# Patient Record
Sex: Female | Born: 1995 | Race: White | Hispanic: No | Marital: Single | Smoking: Former smoker
Health system: Southern US, Community
[De-identification: ages and names within clinical notes are randomized; demographics above are authoritative.]

## PROBLEM LIST (undated history)

## (undated) DIAGNOSIS — O24419 Gestational diabetes mellitus in pregnancy, unspecified control: Secondary | ICD-10-CM

## (undated) DIAGNOSIS — E119 Type 2 diabetes mellitus without complications: Secondary | ICD-10-CM

## (undated) DIAGNOSIS — F32A Depression, unspecified: Secondary | ICD-10-CM

## (undated) DIAGNOSIS — F329 Major depressive disorder, single episode, unspecified: Secondary | ICD-10-CM

## (undated) HISTORY — PX: NO PAST SURGERIES: SHX2092

## (undated) HISTORY — DX: Major depressive disorder, single episode, unspecified: F32.9

## (undated) HISTORY — DX: Gestational diabetes mellitus in pregnancy, unspecified control: O24.419

## (undated) HISTORY — DX: Depression, unspecified: F32.A

---

## 2018-04-14 ENCOUNTER — Ambulatory Visit (INDEPENDENT_AMBULATORY_CARE_PROVIDER_SITE_OTHER): Payer: PRIVATE HEALTH INSURANCE | Admitting: Family Medicine

## 2018-04-14 ENCOUNTER — Encounter: Payer: Self-pay | Admitting: Family Medicine

## 2018-04-14 VITALS — BP 124/80 | HR 117 | Temp 98.2°F | Ht 63.0 in | Wt 197.5 lb

## 2018-04-14 DIAGNOSIS — J069 Acute upper respiratory infection, unspecified: Secondary | ICD-10-CM | POA: Diagnosis not present

## 2018-04-14 DIAGNOSIS — H6982 Other specified disorders of Eustachian tube, left ear: Secondary | ICD-10-CM | POA: Diagnosis not present

## 2018-04-14 DIAGNOSIS — H6502 Acute serous otitis media, left ear: Secondary | ICD-10-CM | POA: Insufficient documentation

## 2018-04-14 MED ORDER — PREDNISONE 20 MG PO TABS: 20.0000 mg | ORAL_TABLET | Freq: Two times a day (BID) | ORAL | 0 refills | Status: AC

## 2018-04-14 MED ORDER — SALINE SPRAY 0.65 % NA SOLN: 1.0000 | NASAL | 0 refills | Status: DC | PRN

## 2018-04-14 NOTE — Progress Notes (Signed)
Established Patient Office Visit  Subjective:  Patient ID: Tricia Solomon, female    DOB: Jul 25, 1995  Age: 23 y.o. MRN: [id number]  CC:  Chief Complaint  Patient presents with  . Establish Care    HPI Tricia Solomon presents for treatment and evaluation of a 3-day history of URI symptoms to include nasal congestion postnasal drip, left ear congestion with pain.  There is been no facial pressure teeth pain fever chills nausea or vomiting.  Patient denies headache.  Patient has no asthma history.  She does not smoke.  She did have a history of otitis as a child.  Patient is tried Sudafed without relief.  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  Her parents are in their early 10s and in relatively good health.  Mom recently had a hysterectomy.  Past Medical History:  Diagnosis Date  . Depression     History reviewed. No pertinent surgical history.  History reviewed. No pertinent family history.  Social History   Socioeconomic History  . Marital status: Single    Spouse name: Not on file  . Number of children: Not on file  . Years of education: Not on file  . Highest education level: Not on file  Occupational History  . Not on file  Social Needs  . Financial resource strain: Not on file  . Food insecurity:    Worry: Not on file    Inability: Not on file  . Transportation needs:    Medical: Not on file    Non-medical: Not on file  Tobacco Use  . Smoking status: Current Every Day Smoker  . Smokeless tobacco: Never Used  Substance and Sexual Activity  . Alcohol use: Yes  . Drug use: Yes  . Sexual activity: Yes  Lifestyle  . Physical activity:    Days per week: Not on file    Minutes per session: Not on file  . Stress: Not on file  Relationships  . Social connections:    Talks on phone: Not on file    Gets together: Not on file    Attends religious service: Not on file    Active member of club or organization:  Not on file    Attends meetings of clubs or organizations: Not on file    Relationship status: Not on file  . Intimate partner violence:    Fear of current or ex partner: Not on file    Emotionally abused: Not on file    Physically abused: Not on file    Forced sexual activity: Not on file  Other Topics Concern  . Not on file  Social History Narrative  . Not on file    No outpatient medications prior to visit.   No facility-administered medications prior to visit.     No Known Allergies  ROS Review of Systems  Constitutional: Negative for chills, diaphoresis, fatigue, fever and unexpected weight change.  HENT: Positive for congestion, ear pain, hearing loss, postnasal drip, rhinorrhea and sore throat. Negative for ear discharge, sinus pressure, sinus pain, sneezing and voice change.   Eyes: Negative for photophobia and visual disturbance.  Respiratory: Negative for cough, shortness of breath and wheezing.   Cardiovascular: Negative.   Gastrointestinal: Negative.  Negative for nausea and vomiting.  Musculoskeletal: Negative for arthralgias and myalgias.  Skin: Negative for pallor and rash.  Allergic/Immunologic: Negative for immunocompromised state.  Neurological: Negative for headaches.  Hematological: Does not  bruise/bleed easily.  Psychiatric/Behavioral: Negative.       Objective:    Physical Exam  Constitutional: She is oriented to person, place, and time. She appears well-developed and well-nourished. No distress.  HENT:  Head: Normocephalic and atraumatic.  Right Ear: External ear normal. Tympanic membrane is not injected, not scarred, not perforated, not erythematous, not retracted and not bulging.  Left Ear: External ear normal. Tympanic membrane is injected. Tympanic membrane is not scarred, not perforated, not retracted and not bulging. A middle ear effusion (CLEAR) is present.  Mouth/Throat: Oropharynx is clear and moist. No oropharyngeal exudate.  Eyes:  Pupils are equal, round, and reactive to light. Conjunctivae are normal. Right eye exhibits no discharge. Left eye exhibits no discharge. No scleral icterus.  Neck: Neck supple. No JVD present. No tracheal deviation present. No thyromegaly present.  Cardiovascular: Normal rate, regular rhythm and normal heart sounds.  Pulmonary/Chest: Effort normal and breath sounds normal. No stridor. No respiratory distress. She has no wheezes. She has no rales.  Abdominal: Bowel sounds are normal.  Lymphadenopathy:    She has no cervical adenopathy.  Neurological: She is alert and oriented to person, place, and time.  Skin: Skin is warm and dry. She is not diaphoretic.  Psychiatric: She has a normal mood and affect. Her behavior is normal.    BP 124/80   Pulse (!) 117   Temp 98.2 F (36.8 C) (Oral)   Ht 5' 3"  (1.6 m)   Wt 197 lb 8 oz (89.6 kg)   LMP 03/25/2018   SpO2 97%   BMI 34.99 kg/m  Wt Readings from Last 3 Encounters:  04/14/18 197 lb 8 oz (89.6 kg)   BP Readings from Last 3 Encounters:  04/14/18 124/80   Guideline developer:  UpToDate (see UpToDate for funding source) Date Released: June 2014  Health Maintenance Due  Topic Date Due  . HIV Screening  06/23/2012  . TETANUS/TDAP  06/23/2016  . INFLUENZA VACCINE  10/23/2017    There are no preventive care reminders to display for this patient.  No results found for: TSH No results found for: WBC, HGB, HCT, MCV, PLT No results found for: NA, K, CHLORIDE, CO2, GLUCOSE, BUN, CREATININE, BILITOT, ALKPHOS, AST, ALT, PROT, ALBUMIN, CALCIUM, ANIONGAP, EGFR, GFR No results found for: CHOL No results found for: HDL No results found for: LDLCALC No results found for: TRIG No results found for: CHOLHDL No results found for: HGBA1C    Assessment & Plan:   Problem List Items Addressed This Visit      Respiratory   Viral upper respiratory tract infection - Primary   Relevant Medications   sodium chloride (OCEAN) 0.65 % SOLN nasal  spray     Nervous and Auditory   Acute serous otitis media of left ear   Relevant Medications   predniSONE (DELTASONE) 20 MG tablet   Dysfunction of left eustachian tube   Relevant Medications   predniSONE (DELTASONE) 20 MG tablet      Meds ordered this encounter  Medications  . predniSONE (DELTASONE) 20 MG tablet    Sig: Take 1 tablet (20 mg total) by mouth 2 (two) times daily with a meal for 7 days.    Dispense:  14 tablet    Refill:  0  . sodium chloride (OCEAN) 0.65 % SOLN nasal spray    Sig: Place 1 spray into both nostrils as needed for congestion.    Dispense:  1 Bottle    Refill:  0  Follow-up: Return in about 6 days (around 04/20/2018), or if symptoms worsen or fail to improve.  Patient was given information on eustachian tube dysfunction.  We discussed some of the common side effects of prednisone therapy.  She will use saltwater nose spray liberally.  Follow-up early next week if not entirely improved.

## 2018-04-14 NOTE — Patient Instructions (Signed)

## 2018-05-08 ENCOUNTER — Encounter: Payer: Self-pay | Admitting: Family Medicine

## 2018-05-08 ENCOUNTER — Ambulatory Visit (INDEPENDENT_AMBULATORY_CARE_PROVIDER_SITE_OTHER): Payer: PRIVATE HEALTH INSURANCE | Admitting: Family Medicine

## 2018-05-08 VITALS — BP 128/80 | HR 80 | Temp 98.1°F | Ht 63.0 in | Wt 202.0 lb

## 2018-05-08 DIAGNOSIS — Z0000 Encounter for general adult medical examination without abnormal findings: Secondary | ICD-10-CM | POA: Diagnosis not present

## 2018-05-08 DIAGNOSIS — F418 Other specified anxiety disorders: Secondary | ICD-10-CM | POA: Diagnosis not present

## 2018-05-08 DIAGNOSIS — Z3009 Encounter for other general counseling and advice on contraception: Secondary | ICD-10-CM | POA: Insufficient documentation

## 2018-05-08 MED ORDER — PAROXETINE HCL 10 MG PO TABS: ORAL_TABLET | ORAL | 0 refills | Status: DC

## 2018-05-08 NOTE — Patient Instructions (Signed)
Living With Depression Everyone experiences occasional disappointment, sadness, and loss in their lives. When you are feeling down, blue, or sad for at least 2 weeks in a row, it may mean that you have depression. Depression can affect your thoughts and feelings, relationships, daily activities, and physical health. It is caused by changes in the way your brain functions. If you receive a diagnosis of depression, your health care provider will tell you which type of depression you have and what treatment options are available to you. If you are living with depression, there are ways to help you recover from it and also ways to prevent it from coming back. How to cope with lifestyle changes Coping with stress     Stress is your body's reaction to life changes and events, both good and bad. Stressful situations may include:  Getting married.  The death of a spouse.  Losing a job.  Retiring.  Having a baby. Stress can last just a few hours or it can be ongoing. Stress can play a major role in depression, so it is important to learn both how to cope with stress and how to think about it differently. Talk with your health care provider or a counselor if you would like to learn more about stress reduction. He or she may suggest some stress reduction techniques, such as:  Music therapy. This can include creating music or listening to music. Choose music that you enjoy and that inspires you.  Mindfulness-based meditation. This kind of meditation can be done while sitting or walking. It involves being aware of your normal breaths, rather than trying to control your breathing.  Centering prayer. This is a kind of meditation that involves focusing on a spiritual word or phrase. Choose a word, phrase, or sacred image that is meaningful to you and that brings you peace.  Deep breathing. To do this, expand your stomach and inhale slowly through your nose. Hold your breath for 3-5 seconds, then exhale  slowly, allowing your stomach muscles to relax.  Muscle relaxation. This involves intentionally tensing muscles then relaxing them. Choose a stress reduction technique that fits your lifestyle and personality. Stress reduction techniques take time and practice to develop. Set aside 5-15 minutes a day to do them. Therapists can offer training in these techniques. The training may be covered by some insurance plans. Other things you can do to manage stress include:  Keeping a stress diary. This can help you learn what triggers your stress and ways to control your response.  Understanding what your limits are and saying no to requests or events that lead to a schedule that is too full.  Thinking about how you respond to certain situations. You may not be able to control everything, but you can control how you react.  Adding humor to your life by watching funny films or TV shows.  Making time for activities that help you relax and not feeling guilty about spending your time this way.  Medicines Your health care provider may suggest certain medicines if he or she feels that they will help improve your condition. Avoid using alcohol and other substances that may prevent your medicines from working properly (may interact). It is also important to:  Talk with your pharmacist or health care provider about all the medicines that you take, their possible side effects, and what medicines are safe to take together.  Make it your goal to take part in all treatment decisions (shared decision-making). This includes giving input on   the side effects of medicines. It is best if shared decision-making with your health care provider is part of your total treatment plan. If your health care provider prescribes a medicine, you may not notice the full benefits of it for 4-8 weeks. Most people who are treated for depression need to be on medicine for at least 6-12 months after they feel better. If you are taking  medicines as part of your treatment, do not stop taking medicines without first talking to your health care provider. You may need to have the medicine slowly decreased (tapered) over time to decrease the risk of harmful side effects. Relationships Your health care provider may suggest family therapy along with individual therapy and drug therapy. While there may not be family problems that are causing you to feel depressed, it is still important to make sure your family learns as much as they can about your mental health. Having your family's support can help make your treatment successful. How to recognize changes in your condition Everyone has a different response to treatment for depression. Recovery from major depression happens when you have not had signs of major depression for two months. This may mean that you will start to:  Have more interest in doing activities.  Feel less hopeless than you did 2 months ago.  Have more energy.  Overeat less often, or have better or improving appetite.  Have better concentration. Your health care provider will work with you to decide the next steps in your recovery. It is also important to recognize when your condition is getting worse. Watch for these signs:  Having fatigue or low energy.  Eating too much or too little.  Sleeping too much or too little.  Feeling restless, agitated, or hopeless.  Having trouble concentrating or making decisions.  Having unexplained physical complaints.  Feeling irritable, angry, or aggressive. Get help as soon as you or your family members notice these symptoms coming back. How to get support and help from others How to talk with friends and family members about your condition  Talking to friends and family members about your condition can provide you with one way to get support and guidance. Reach out to trusted friends or family members, explain your symptoms to them, and let them know that you are  working with a health care provider to treat your depression. Financial resources Not all insurance plans cover mental health care, so it is important to check with your insurance carrier. If paying for co-pays or counseling services is a problem, search for a local or county mental health care center. They may be able to offer public mental health care services at low or no cost when you are not able to see a private health care provider. If you are taking medicine for depression, you may be able to get the generic form, which may be less expensive. Some makers of prescription medicines also offer help to patients who cannot afford the medicines they need. Follow these instructions at home:   Get the right amount and quality of sleep.  Cut down on using caffeine, tobacco, alcohol, and other potentially harmful substances.  Try to exercise, such as walking or lifting small weights.  Take over-the-counter and prescription medicines only as told by your health care provider.  Eat a healthy diet that includes plenty of vegetables, fruits, whole grains, low-fat dairy products, and lean protein. Do not eat a lot of foods that are high in solid fats, added sugars, or salt.    Keep all follow-up visits as told by your health care provider. This is important. Contact a health care provider if:  You stop taking your antidepressant medicines, and you have any of these symptoms: ? Nausea. ? Headache. ? Feeling lightheaded. ? Chills and body aches. ? Not being able to sleep (insomnia).  You or your friends and family think your depression is getting worse. Get help right away if:  You have thoughts of hurting yourself or others. If you ever feel like you may hurt yourself or others, or have thoughts about taking your own life, get help right away. You can go to your nearest emergency department or call:  Your local emergency services (911 in the [place]  A suicide crisis helpline, such as the  National Suicide Prevention Lifeline at 952-757-40711-(424) 042-7636. This is open 24-hours a day. Summary  If you are living with depression, there are ways to help you recover from it and also ways to prevent it from coming back.  Work with your health care team to create a management plan that includes counseling, stress management techniques, and healthy lifestyle habits. This information is not intended to replace advice given to you by your health care provider. Make sure you discuss any questions you have with your health care provider. Document Released: 02/12/2016 Document Revised: 02/12/2016 Document Reviewed: 02/12/2016 Elsevier Interactive Patient Education  2019 [employer] Paroxetine tablets What is this medicine? PAROXETINE (pa ROX e teen) is used to treat depression. It may also be used to treat anxiety disorders, obsessive compulsive disorder, panic attacks, post traumatic stress, and premenstrual dysphoric disorder (PMDD). This medicine may be used for other purposes; ask your health care provider or pharmacist if you have questions. COMMON BRAND NAME(S): Paxil, Pexeva What should I tell my health care provider before I take this medicine? They need to know if you have any of these conditions: -bipolar disorder or a family history of bipolar disorder -bleeding disorders -glaucoma -heart disease -kidney disease -liver disease -low levels of sodium in the blood -seizures -suicidal thoughts, plans, or attempt; a previous suicide attempt by you or a family member -take MAOIs like Carbex, Eldepryl, Marplan, Nardil, and Parnate -take medicines that treat or prevent blood clots -thyroid disease -an unusual or allergic reaction to paroxetine, other medicines, foods, dyes, or preservatives -pregnant or trying to get pregnant -breast-feeding How should I use this medicine? Take this medicine by mouth with a glass of water. Follow the directions on the prescription label. You can take it  with or without food. Take your medicine at regular intervals. Do not take your medicine more often than directed. Do not stop taking this medicine suddenly except upon the advice of your doctor. Stopping this medicine too quickly may cause serious side effects or your condition may worsen. A special MedGuide will be given to you by the pharmacist with each prescription and refill. Be sure to read this information carefully each time. Talk to your pediatrician regarding the use of this medicine in children. Special care may be needed. Overdosage: If you think you have taken too much of this medicine contact a poison control center or emergency room at once. NOTE: This medicine is only for you. Do not share this medicine with others. What if I miss a dose? If you miss a dose, take it as soon as you can. If it is almost time for your next dose, take only that dose. Do not take double or extra doses. What may interact with this  medicine? Do not take this medicine with any of the following medications: -linezolid -MAOIs like Carbex, Eldepryl, Marplan, Nardil, and Parnate -methylene blue (injected into a vein) -pimozide -thioridazine This medicine may also interact with the following medications: -alcohol -amphetamines -aspirin and aspirin-like medicines -atomoxetine -certain medicines for depression, anxiety, or psychotic disturbances -certain medicines for irregular heart beat like propafenone, flecainide, encainide, and quinidine -certain medicines for migraine headache like almotriptan, eletriptan, frovatriptan, naratriptan, rizatriptan, sumatriptan, zolmitriptan -cimetidine -digoxin -diuretics -fentanyl -fosamprenavir -furazolidone -isoniazid -lithium -medicines that treat or prevent blood clots like warfarin, enoxaparin, and dalteparin -medicines for sleep -NSAIDs, medicines for pain and inflammation, like ibuprofen or  naproxen -phenobarbital -phenytoin -procarbazine -rasagiline -ritonavir -supplements like St. John's wort, kava kava, valerian -tamoxifen -tramadol -tryptophan This list may not describe all possible interactions. Give your health care provider a list of all the medicines, herbs, non-prescription drugs, or dietary supplements you use. Also tell them if you smoke, drink alcohol, or use illegal drugs. Some items may interact with your medicine. What should I watch for while using this medicine? Tell your doctor if your symptoms do not get better or if they get worse. Visit your doctor or health care professional for regular checks on your progress. Because it may take several weeks to see the full effects of this medicine, it is important to continue your treatment as prescribed by your doctor. Patients and their families should watch out for new or worsening thoughts of suicide or depression. Also watch out for sudden changes in feelings such as feeling anxious, agitated, panicky, irritable, hostile, aggressive, impulsive, severely restless, overly excited and hyperactive, or not being able to sleep. If this happens, especially at the beginning of treatment or after a change in dose, call your health care professional. Bonita Quin may get drowsy or dizzy. Do not drive, use machinery, or do anything that needs mental alertness until you know how this medicine affects you. Do not stand or sit up quickly, especially if you are an older patient. This reduces the risk of dizzy or fainting spells. Alcohol may interfere with the effect of this medicine. Avoid alcoholic drinks. Your mouth may get dry. Chewing sugarless gum or sucking hard candy, and drinking plenty of water will help. Contact your doctor if the problem does not go away or is severe. What side effects may I notice from receiving this medicine? Side effects that you should report to your doctor or health care professional as soon as  possible: -allergic reactions like skin rash, itching or hives, swelling of the face, lips, or tongue -anxious -black, tarry stools -changes in vision -confusion -elevated mood, decreased need for sleep, racing thoughts, impulsive behavior -eye pain -fast, irregular heartbeat -feeling faint or lightheaded, falls -feeling agitated, angry, or irritable -hallucination, loss of contact with reality -loss of balance or coordination -loss of memory -painful or prolonged erections -restlessness, pacing, inability to keep still -seizures -stiff muscles -suicidal thoughts or other mood changes -trouble sleeping -unusual bleeding or bruising -unusually weak or tired -vomiting Side effects that usually do not require medical attention (report to your doctor or health care professional if they continue or are bothersome): -change in appetite or weight -change in sex drive or performance -diarrhea -dizziness -dry mouth -increased sweating -indigestion, nausea -tired -tremors This list may not describe all possible side effects. Call your doctor for medical advice about side effects. You may report side effects to FDA at 1-800-FDA-1088. Where should I keep my medicine? Keep out of the reach of children.  Store at room temperature between 15 and 30 degrees C (59 and 86 degrees F). Keep container tightly closed. Throw away any unused medicine after the expiration date. NOTE: This sheet is a summary. It may not cover all possible information. If you have questions about this medicine, talk to your doctor, pharmacist, or health care provider.  2019 Elsevier/Gold Standard (2015-08-12 15:50:32)

## 2018-05-08 NOTE — Progress Notes (Signed)
Established Patient Office Visit  Subjective:  Patient ID: Tricia Solomon, female    DOB: Aug 14, 1995  Age: 23 y.o. MRN: [id number]  CC:  Chief Complaint  Patient presents with  . Anxiety    HPI Tricia Solomon presents for evaluation and treatment of anxiety.  Patient has a past medical history of anxiety with depression.  It is been treated with Paxil and then Zoloft in her past.  Patient did better with the Paxil.  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  Feels as though things are going well with her significant other.  Her parents remain in [place].  They are in their 64s and are in good health.  Patient does not smoke or use illicit drugs.  She rarely drinks alcohol.  She is not exercising regularly.  She is planning follow-up for Pap and pelvic exam in March.  She is fasting today.  Past Medical History:  Diagnosis Date  . Depression     History reviewed. No pertinent surgical history.  History reviewed. No pertinent family history.  Social History   Socioeconomic History  . Marital status: Single    Spouse name: Not on file  . Number of children: Not on file  . Years of education: Not on file  . Highest education level: Not on file  Occupational History  . Not on file  Social Needs  . Financial resource strain: Not on file  . Food insecurity:    Worry: Not on file    Inability: Not on file  . Transportation needs:    Medical: Not on file    Non-medical: Not on file  Tobacco Use  . Smoking status: Current Every Day Smoker  . Smokeless tobacco: Never Used  Substance and Sexual Activity  . Alcohol use: Yes  . Drug use: Yes  . Sexual activity: Yes  Lifestyle  . Physical activity:    Days per week: Not on file    Minutes per session: Not on file  . Stress: Not on file  Relationships  . Social connections:    Talks on phone: Not on file    Gets together: Not on file     Attends religious service: Not on file    Active member of club or organization: Not on file    Attends meetings of clubs or organizations: Not on file    Relationship status: Not on file  . Intimate partner violence:    Fear of current or ex partner: Not on file    Emotionally abused: Not on file    Physically abused: Not on file    Forced sexual activity: Not on file  Other Topics Concern  . Not on file  Social History Narrative  . Not on file    Outpatient Medications Prior to Visit  Medication Sig Dispense Refill  . sodium chloride (OCEAN) 0.65 % SOLN nasal spray Place 1 spray into both nostrils as needed for congestion. 1 Bottle 0   No facility-administered medications prior to visit.     No Known Allergies  ROS Review of Systems  Constitutional: Negative for diaphoresis, fever and unexpected weight change.  HENT: Negative.   Eyes: Negative.   Respiratory: Negative.   Cardiovascular: Negative.   Gastrointestinal: Negative.   Genitourinary: Negative.   Allergic/Immunologic: Negative for immunocompromised state.  Neurological: Negative for headaches.  Hematological: Does not bruise/bleed easily.  Psychiatric/Behavioral: Positive for dysphoric mood. The patient is nervous/anxious.    Depression screen PHQ 2/9 05/08/2018  Decreased Interest 1  Down, Depressed, Hopeless 1  PHQ - 2 Score 2  Altered sleeping 2  Tired, decreased energy 2  Change in appetite 2  Feeling bad or failure about yourself  2  Trouble concentrating 1  Moving slowly or fidgety/restless 0  Suicidal thoughts 0  PHQ-9 Score 11      Objective:    Physical Exam  Constitutional: She is oriented to person, place, and time. She appears well-developed and well-nourished. No distress.  HENT:  Head: Normocephalic and atraumatic.  Right Ear: External ear normal.  Left Ear: External ear normal.  Mouth/Throat: Oropharynx is clear and moist.  Eyes: Pupils are equal, round, and reactive to light.  Conjunctivae are normal. Right eye exhibits no discharge. Left eye exhibits no discharge. No scleral icterus.  Neck: Neck supple. No JVD present. No tracheal deviation present. No thyromegaly present.  Cardiovascular: Normal rate, regular rhythm and normal heart sounds.  Pulmonary/Chest: Effort normal and breath sounds normal. No stridor.  Lymphadenopathy:    She has no cervical adenopathy.  Neurological: She is alert and oriented to person, place, and time.  Skin: Skin is warm and dry. She is not diaphoretic.  Psychiatric: She has a normal mood and affect. Her behavior is normal.    BP 128/80   Pulse 80   Temp 98.1 F (36.7 C) (Oral)   Ht _0  (1.6 m)   Wt 202 lb (91.6 kg)   SpO2 96%   BMI 35.78 kg/m  Wt Readings from Last 3 Encounters:  05/08/18 202 lb (91.6 kg)  04/14/18 197 lb 8 oz (89.6 kg)   BP Readings from Last 3 Encounters:  05/08/18 128/80  04/14/18 124/80   Guideline developer:  UpToDate (see UpToDate for funding source) Date Released: June 2014  Health Maintenance Due  Topic Date Due  . CHLAMYDIA SCREENING  06/23/2012  . HIV Screening  06/23/2012  . TETANUS/TDAP  06/23/2016  . INFLUENZA VACCINE  10/23/2017    There are no preventive care reminders to display for this patient.  No results found for: TSH No results found for: WBC, HGB, HCT, MCV, PLT No results found for: NA, K, CHLORIDE, CO2, GLUCOSE, BUN, CREATININE, BILITOT, ALKPHOS, AST, ALT, PROT, ALBUMIN, CALCIUM, ANIONGAP, EGFR, GFR No results found for: CHOL No results found for: HDL No results found for: LDLCALC No results found for: TRIG No results found for: CHOLHDL No results found for: HGBA1C    Assessment & Plan:   Problem List Items Addressed This Visit      Other   Depression with anxiety - Primary   Relevant Medications   PARoxetine (PAXIL) 10 MG tablet   Healthcare maintenance   Relevant Orders   CBC   Comprehensive metabolic panel   Lipid panel   Urinalysis, Routine w  reflex microscopic      Meds ordered this encounter  Medications  . PARoxetine (PAXIL) 10 MG tablet    Sig: Take one daily for one week and then increase to 2 daily.    Dispense:  60 tablet    Refill:  0    Follow-up: Return in about 4 weeks (around 06/05/2018).    Patient was given information on depression and Paxil.  Warned her about weight gain and delayed orgasm.  She will follow-up in 4 weeks for recheck.

## 2018-05-09 LAB — COMPREHENSIVE METABOLIC PANEL
AG Ratio: 1.6 (calc) (ref 1.0–2.5)
ALBUMIN MSPROF: 4.2 g/dL (ref 3.6–5.1)
ALKALINE PHOSPHATASE (APISO): 66 U/L (ref 31–125)
ALT: 13 U/L (ref 6–29)
AST: 11 U/L (ref 10–30)
BUN: 9 mg/dL (ref 7–25)
CO2: 24 mmol/L (ref 20–32)
Calcium: 9 mg/dL (ref 8.6–10.2)
Chloride: 104 mmol/L (ref 98–110)
Creat: 0.66 mg/dL (ref 0.50–1.10)
Globulin: 2.7 g/dL (calc) (ref 1.9–3.7)
Glucose, Bld: 78 mg/dL (ref 65–99)
Potassium: 4 mmol/L (ref 3.5–5.3)
Sodium: 138 mmol/L (ref 135–146)
Total Bilirubin: 1 mg/dL (ref 0.2–1.2)
Total Protein: 6.9 g/dL (ref 6.1–8.1)

## 2018-05-09 LAB — URINALYSIS, ROUTINE W REFLEX MICROSCOPIC
Bilirubin Urine: NEGATIVE
Glucose, UA: NEGATIVE
Hyaline Cast: NONE SEEN /LPF
Nitrite: NEGATIVE
Specific Gravity, Urine: 1.03 (ref 1.001–1.03)
pH: 6 (ref 5.0–8.0)

## 2018-05-09 LAB — CBC
HCT: 41.4 % (ref 35.0–45.0)
Hemoglobin: 14.1 g/dL (ref 11.7–15.5)
MCH: 31.4 pg (ref 27.0–33.0)
MCHC: 34.1 g/dL (ref 32.0–36.0)
MCV: 92.2 fL (ref 80.0–100.0)
MPV: 10 fL (ref 7.5–12.5)
Platelets: 281 10*3/uL (ref 140–400)
RBC: 4.49 10*6/uL (ref 3.80–5.10)
RDW: 12.3 % (ref 11.0–15.0)
WBC: 10.2 10*3/uL (ref 3.8–10.8)

## 2018-05-09 LAB — LIPID PANEL
Cholesterol: 263 mg/dL — ABNORMAL HIGH (ref <200)
HDL: 55 mg/dL (ref >=50)
LDL Cholesterol (Calc): 177 mg/dL (calc) — ABNORMAL HIGH
Non-HDL Cholesterol (Calc): 208 mg/dL (calc) — ABNORMAL HIGH (ref <130)
Total CHOL/HDL Ratio: 4.8 (calc) (ref <5.0)
Triglycerides: 160 mg/dL — ABNORMAL HIGH (ref <150)

## 2018-06-05 ENCOUNTER — Other Ambulatory Visit: Payer: Self-pay

## 2018-06-05 ENCOUNTER — Encounter: Payer: Self-pay | Admitting: Family Medicine

## 2018-06-05 ENCOUNTER — Ambulatory Visit (INDEPENDENT_AMBULATORY_CARE_PROVIDER_SITE_OTHER): Payer: PRIVATE HEALTH INSURANCE | Admitting: Family Medicine

## 2018-06-05 VITALS — BP 120/70 | HR 99 | Ht 63.0 in

## 2018-06-05 DIAGNOSIS — F418 Other specified anxiety disorders: Secondary | ICD-10-CM | POA: Diagnosis not present

## 2018-06-05 MED ORDER — PAROXETINE HCL 20 MG PO TABS: 20.0000 mg | ORAL_TABLET | Freq: Every day | ORAL | 0 refills | Status: DC

## 2018-06-05 NOTE — Progress Notes (Signed)
Established Patient Office Visit  Subjective:  Patient ID: Tricia Solomon, female    DOB: 10/05/1995  Age: 23 y.o. MRN: [id number]  CC:  Chief Complaint  Patient presents with  . Follow-up    HPI [name] presents for follow-up status post starting Paxil.  Patient is starting to feel better and feels as though her mood has been elevated to an extent.  There is been less anxiety and she is eating less as well.  She and her boyfriend are exercising in the evening before bed.  She has noted some daytime grogginess.  He continues to take the Paxil at night only.  She has taken 30 mg in the distant past.  Her boyfriend is in college and is a theater major.  He hopes to Geologist, engineering.  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  Past Medical History:  Diagnosis Date  . Depression     History reviewed. No pertinent surgical history.  History reviewed. No pertinent family history.  Social History   Socioeconomic History  . Marital status: Single    Spouse name: Not on file  . Number of children: Not on file  . Years of education: Not on file  . Highest education level: Not on file  Occupational History  . Not on file  Social Needs  . Financial resource strain: Not on file  . Food insecurity:    Worry: Not on file    Inability: Not on file  . Transportation needs:    Medical: Not on file    Non-medical: Not on file  Tobacco Use  . Smoking status: Current Every Day Smoker  . Smokeless tobacco: Never Used  Substance and Sexual Activity  . Alcohol use: Yes  . Drug use: Yes  . Sexual activity: Yes  Lifestyle  . Physical activity:    Days per week: Not on file    Minutes per session: Not on file  . Stress: Not on file  Relationships  . Social connections:    Talks on phone: Not on file    Gets together: Not on file    Attends religious service: Not on file    Active member of club or organization: Not on file    Attends meetings of clubs or  organizations: Not on file    Relationship status: Not on file  . Intimate partner violence:    Fear of current or ex partner: Not on file    Emotionally abused: Not on file    Physically abused: Not on file    Forced sexual activity: Not on file  Other Topics Concern  . Not on file  Social History Narrative  . Not on file    Outpatient Medications Prior to Visit  Medication Sig Dispense Refill  . sodium chloride (OCEAN) 0.65 % SOLN nasal spray Place 1 spray into both nostrils as needed for congestion. 1 Bottle 0  . PARoxetine (PAXIL) 10 MG tablet Take one daily for one week and then increase to 2 daily. 60 tablet 0   No facility-administered medications prior to visit.     No Known Allergies  ROS Review of Systems  Constitutional: Negative.   Respiratory: Negative.   Cardiovascular: Negative.   Gastrointestinal: Negative.    Depression screen [facility] 2/9 06/05/2018 05/08/2018  Decreased Interest 1 1  Down, Depressed, Hopeless 1 1  PHQ - 2 Score 2 2  Altered sleeping 2 2  Tired, decreased energy 2 2  Change in appetite 1 2  Feeling bad or failure about yourself  0 2  Trouble concentrating 1 1  Moving slowly or fidgety/restless 0 0  Suicidal thoughts 0 0  PHQ-9 Score 8 11      Objective:    Physical Exam  Constitutional: She is oriented to person, place, and time. She appears well-developed and well-nourished. No distress.  HENT:  Head: Normocephalic and atraumatic.  Right Ear: External ear normal.  Left Ear: External ear normal.  Eyes: Conjunctivae are normal. Right eye exhibits no discharge. Left eye exhibits no discharge. No scleral icterus.  Neck: Neck supple. No JVD present. No tracheal deviation present.  Pulmonary/Chest: Effort normal. No stridor.  Neurological: She is alert and oriented to person, place, and time.  Skin: She is not diaphoretic.  Psychiatric: She has a normal mood and affect. Her behavior is normal.    BP 120/70   Pulse 99   Ht 5\' 3"   (1.6 m)   SpO2 97%   BMI 35.78 kg/m  Wt Readings from Last 3 Encounters:  05/08/18 202 lb (91.6 kg)  04/14/18 197 lb 8 oz (89.6 kg)   BP Readings from Last 3 Encounters:  06/05/18 120/70  05/08/18 128/80  04/14/18 124/80   Guideline developer:  UpToDate (see UpToDate for funding source) Date Released: June 2014  Health Maintenance Due  Topic Date Due  . CHLAMYDIA SCREENING  06/23/2012  . HIV Screening  06/23/2012  . TETANUS/TDAP  06/23/2016  . INFLUENZA VACCINE  10/23/2017    There are no preventive care reminders to display for this patient.  No results found for: TSH Lab Results  Component Value Date   WBC 10.2 05/08/2018   HGB 14.1 05/08/2018   HCT 41.4 05/08/2018   MCV 92.2 05/08/2018   PLT 281 05/08/2018   Lab Results  Component Value Date   NA 138 05/08/2018   K 4.0 05/08/2018   CO2 24 05/08/2018   GLUCOSE 78 05/08/2018   BUN 9 05/08/2018   CREATININE 0.66 05/08/2018   BILITOT 1.0 05/08/2018   AST 11 05/08/2018   ALT 13 05/08/2018   PROT 6.9 05/08/2018   CALCIUM 9.0 05/08/2018   Lab Results  Component Value Date   CHOL 263 (H) 05/08/2018   Lab Results  Component Value Date   HDL 55 05/08/2018   Lab Results  Component Value Date   LDLCALC 177 (H) 05/08/2018   Lab Results  Component Value Date   TRIG 160 (H) 05/08/2018   Lab Results  Component Value Date   CHOLHDL 4.8 05/08/2018   No results found for: HGBA1C    Assessment & Plan:   Problem List Items Addressed This Visit      Other   Depression with anxiety - Primary   Relevant Medications   PARoxetine (PAXIL) 20 MG tablet      Meds ordered this encounter  Medications  . PARoxetine (PAXIL) 20 MG tablet    Sig: Take 1 tablet (20 mg total) by mouth daily.    Dispense:  90 tablet    Refill:  0    Follow-up: Return return in 4-6 weeks.   We will continue 20 mg for now.  Suggested that patient may want to try taking it in the morning.  She will return in 4 to 6 weeks for  follow-up.

## 2018-07-10 ENCOUNTER — Encounter: Payer: Self-pay | Admitting: Family Medicine

## 2018-07-10 ENCOUNTER — Ambulatory Visit (INDEPENDENT_AMBULATORY_CARE_PROVIDER_SITE_OTHER): Payer: PRIVATE HEALTH INSURANCE | Admitting: Family Medicine

## 2018-07-10 DIAGNOSIS — F418 Other specified anxiety disorders: Secondary | ICD-10-CM | POA: Diagnosis not present

## 2018-07-10 MED ORDER — PAROXETINE HCL 20 MG PO TABS: 20.0000 mg | ORAL_TABLET | Freq: Every day | ORAL | 1 refills | Status: DC

## 2018-07-10 NOTE — Progress Notes (Signed)
Established Patient Office Visit  Subjective:  Patient ID: Tricia MassonHanna Mcmonigle, female    DOB: 02/24/1996  Age: 23 y.o. MRN: [id number]  CC:  Chief Complaint  Patient presents with  . Follow-up    HPI Tricia MassonHanna Kozel presents for follow-up of depression and anxiety.  She is doing well on the Paxil at this point.  Mood is been elevated.  She is exercising but is a little tired of being cooped up at home.  She still has her job and has been working from home.  Significant other will finish college and is planning to find a job locally.  Past Medical History:  Diagnosis Date  . Depression     History reviewed. No pertinent surgical history.  History reviewed. No pertinent family history.  Social History   Socioeconomic History  . Marital status: Single    Spouse name: Not on file  . Number of children: Not on file  . Years of education: Not on file  . Highest education level: Not on file  Occupational History  . Not on file  Social Needs  . Financial resource strain: Not on file  . Food insecurity:    Worry: Not on file    Inability: Not on file  . Transportation needs:    Medical: Not on file    Non-medical: Not on file  Tobacco Use  . Smoking status: Current Every Day Smoker  . Smokeless tobacco: Never Used  Substance and Sexual Activity  . Alcohol use: Yes  . Drug use: Yes  . Sexual activity: Yes  Lifestyle  . Physical activity:    Days per week: Not on file    Minutes per session: Not on file  . Stress: Not on file  Relationships  . Social connections:    Talks on phone: Not on file    Gets together: Not on file    Attends religious service: Not on file    Active member of club or organization: Not on file    Attends meetings of clubs or organizations: Not on file    Relationship status: Not on file  . Intimate partner violence:    Fear of current or ex partner: Not on file    Emotionally abused: Not on file    Physically abused: Not on file    Forced sexual  activity: Not on file  Other Topics Concern  . Not on file  Social History Narrative  . Not on file    Outpatient Medications Prior to Visit  Medication Sig Dispense Refill  . sodium chloride (OCEAN) 0.65 % SOLN nasal spray Place 1 spray into both nostrils as needed for congestion. 1 Bottle 0  . PARoxetine (PAXIL) 20 MG tablet Take 1 tablet (20 mg total) by mouth daily. 90 tablet 0   No facility-administered medications prior to visit.     No Known Allergies  ROS Review of Systems  Constitutional: Negative.   Respiratory: Negative.   Cardiovascular: Negative.   Gastrointestinal: Negative.   Psychiatric/Behavioral: Negative for self-injury and sleep disturbance. The patient is not nervous/anxious.    [facility] 07/10/2018 06/05/2018 05/08/2018  Decreased Interest 1 1 1   Down, Depressed, Hopeless 0 1 1  PHQ - 2 Score 1 2 2   Altered sleeping 0 2 2  Tired, decreased energy 1 2 2   Change in appetite 0 1 2  Feeling bad or failure about yourself  0 0 2  Trouble concentrating 0 1 1  Moving slowly or  fidgety/restless 0 0 0  Suicidal thoughts 0 0 0  PHQ-9 Score Objective:    Physical Exam  Constitutional: She is oriented to person, place, and time. She appears well-developed and well-nourished. No distress.  HENT:  Head: Normocephalic and atraumatic.  Right Ear: External ear normal.  Left Ear: External ear normal.  Eyes: Right eye exhibits no discharge. Left eye exhibits no discharge. No scleral icterus.  Neck: No JVD present. No tracheal deviation present.  Pulmonary/Chest: Effort normal. No stridor.  Neurological: She is alert and oriented to person, place, and time.  Skin: She is not diaphoretic.  Psychiatric: She has a normal mood and affect. Her behavior is normal.    Ht  (1.6 m)   BMI 35.78 kg/m  Wt Readings from Last 3 Encounters:  05/08/18 202 lb (91.6 kg)  04/14/18 197 lb 8 oz (89.6 kg)     Health Maintenance Due  Topic  Date Due  . CHLAMYDIA SCREENING  06/23/2012  . HIV Screening  06/23/2012  . TETANUS/TDAP  06/23/2016  . PAP-Cervical Cytology Screening  06/24/2018  . PAP SMEAR-Modifier  06/24/2018    There are no preventive care reminders to display for this patient.  No results found for: TSH Lab Results  Component Value Date   WBC 10.2 05/08/2018   HGB 14.1 05/08/2018   HCT 41.4 05/08/2018   MCV 92.2 05/08/2018   PLT 281 05/08/2018   Lab Results  Component Value Date   NA 138 05/08/2018   K 4.0 05/08/2018   CO2 24 05/08/2018   GLUCOSE 78 05/08/2018   BUN 9 05/08/2018   CREATININE 0.66 05/08/2018   BILITOT 1.0 05/08/2018   AST 11 05/08/2018   ALT 13 05/08/2018   PROT 6.9 05/08/2018   CALCIUM 9.0 05/08/2018   Lab Results  Component Value Date   CHOL 263 (H) 05/08/2018   Lab Results  Component Value Date   HDL 55 05/08/2018   Lab Results  Component Value Date   LDLCALC 177 (H) 05/08/2018   Lab Results  Component Value Date   TRIG 160 (H) 05/08/2018   Lab Results  Component Value Date   CHOLHDL 4.8 05/08/2018   No results found for: HGBA1C    Assessment & Plan:   Problem List Items Addressed This Visit      Other   Depression with anxiety   Relevant Medications   PARoxetine (PAXIL) 20 MG tablet      Meds ordered this encounter  Medications  . PARoxetine (PAXIL) 20 MG tablet    Sig: Take 1 tablet (20 mg total) by mouth daily.    Dispense:  90 tablet    Refill:  1    Follow-up: Return Return in 3-6 months or sooner if needed.    Mliss Sax, MDVirtual Visit via Video Note  I connected with Tricia Solomon on 07/10/18 at  9:30 AM EDT by a video enabled telemedicine application and verified that I am speaking with the correct person using two identifiers.   I discussed the limitations of evaluation and management by telemedicine and the availability of in person appointments. The patient expressed understanding and agreed to proceed.  History of  Present Illness:    Observations/Objective:   Assessment and Plan:   Follow Up Instructions:    I discussed the assessment and treatment plan with the patient. The patient was provided an opportunity to ask questions and all were answered. The patient  agreed with the plan and demonstrated an understanding of the instructions.   The patient was advised to call back or seek an in-person evaluation if the symptoms worsen or if the condition fails to improve as anticipated.  I provided 10 minutes of non-face-to-face time during this encounter.

## 2018-08-06 ENCOUNTER — Ambulatory Visit (INDEPENDENT_AMBULATORY_CARE_PROVIDER_SITE_OTHER): Payer: PRIVATE HEALTH INSURANCE | Admitting: Family Medicine

## 2018-08-06 ENCOUNTER — Encounter: Payer: Self-pay | Admitting: Family Medicine

## 2018-08-06 VITALS — Ht 63.0 in

## 2018-08-06 DIAGNOSIS — F418 Other specified anxiety disorders: Secondary | ICD-10-CM

## 2018-08-06 MED ORDER — QUETIAPINE FUMARATE 25 MG PO TABS: 25.0000 mg | ORAL_TABLET | Freq: Every day | ORAL | 1 refills | Status: DC

## 2018-08-06 MED ORDER — PAROXETINE HCL 20 MG PO TABS: 20.0000 mg | ORAL_TABLET | Freq: Every day | ORAL | 1 refills | Status: DC

## 2018-08-06 NOTE — Progress Notes (Signed)
Established Patient Office Visit  Subjective:  Patient ID: Tricia Solomon, female    DOB: [birth year]  Age: 23 y.o. MRN: [id number]  CC:  Chief Complaint  Patient presents with  . Depression    HPI Tricia Solomon presents for follow-up of her depression with anxiety.  Things are worse for him.  Job has gotten more stress.  Things are okay with her significant other who is been helping around the house.  Patient has not been leaving the house much because she does work from home.  She tells of some cycling between elevated mood on some days with more severe depression on others.  Denies any plan or intention for self-harm but does admit that she has had thoughts that things would be better if she were not here.  No history of bipolar disorder.  Denies any inappropriate activity when her mood is elevated.  She is otherwise tolerating the Paxil well.  Past Medical History:  Diagnosis Date  . Depression     History reviewed. No pertinent surgical history.  History reviewed. No pertinent family history.  Social History   Socioeconomic History  . Marital status: Single    Spouse name: Not on file  . Number of children: Not on file  . Years of education: Not on file  . Highest education level: Not on file  Occupational History  . Not on file  Social Needs  . Financial resource strain: Not on file  . Food insecurity:    Worry: Not on file    Inability: Not on file  . Transportation needs:    Medical: Not on file    Non-medical: Not on file  Tobacco Use  . Smoking status: Current Every Day Smoker  . Smokeless tobacco: Never Used  Substance and Sexual Activity  . Alcohol use: Yes  . Drug use: Yes  . Sexual activity: Yes  Lifestyle  . Physical activity:    Days per week: Not on file    Minutes per session: Not on file  . Stress: Not on file  Relationships  . Social connections:    Talks on phone: Not on file    Gets together: Not on file    Attends religious service: Not on  file    Active member of club or organization: Not on file    Attends meetings of clubs or organizations: Not on file    Relationship status: Not on file  . Intimate partner violence:    Fear of current or ex partner: Not on file    Emotionally abused: Not on file    Physically abused: Not on file    Forced sexual activity: Not on file  Other Topics Concern  . Not on file  Social History Narrative  . Not on file    Outpatient Medications Prior to Visit  Medication Sig Dispense Refill  . sodium chloride (OCEAN) 0.65 % SOLN nasal spray Place 1 spray into both nostrils as needed for congestion. 1 Bottle 0  . PARoxetine (PAXIL) 20 MG tablet Take 1 tablet (20 mg total) by mouth daily. 90 tablet 1   No facility-administered medications prior to visit.     No Known Allergies  ROS Review of Systems Depression screen [facility] 2/9 08/06/2018 07/10/2018 06/05/2018  Decreased Interest 3 1 1   Down, Depressed, Hopeless 2 0 1  PHQ - 2 Score 5 1 2   Altered sleeping 0 0 2  Tired, decreased energy 2 1 2   Change in appetite 0 0  1  Feeling bad or failure about yourself  2 0 0  Trouble concentrating 0 0 1  Moving slowly or fidgety/restless 0 0 0  Suicidal thoughts 1 0 0  PHQ-9 Score 10 2 8       Objective:    Physical Exam  Ht 5\' 3"  (1.6 m)   BMI 35.78 kg/m  Wt Readings from Last 3 Encounters:  05/08/18 202 lb (91.6 kg)  04/14/18 197 lb 8 oz (89.6 kg)     Health Maintenance Due  Topic Date Due  . CHLAMYDIA SCREENING  06/23/2012  . HIV Screening  06/23/2012  . TETANUS/TDAP  06/23/2016  . PAP-Cervical Cytology Screening  06/24/2018  . PAP SMEAR-Modifier  06/24/2018    There are no preventive care reminders to display for this patient.  No results found for: TSH Lab Results  Component Value Date   WBC 10.2 05/08/2018   HGB 14.1 05/08/2018   HCT 41.4 05/08/2018   MCV 92.2 05/08/2018   PLT 281 05/08/2018   Lab Results  Component Value Date   NA 138 05/08/2018   K 4.0  05/08/2018   CO2 24 05/08/2018   GLUCOSE 78 05/08/2018   BUN 9 05/08/2018   CREATININE 0.66 05/08/2018   BILITOT 1.0 05/08/2018   AST 11 05/08/2018   ALT 13 05/08/2018   PROT 6.9 05/08/2018   CALCIUM 9.0 05/08/2018   Lab Results  Component Value Date   CHOL 263 (H) 05/08/2018   Lab Results  Component Value Date   HDL 55 05/08/2018   Lab Results  Component Value Date   LDLCALC 177 (H) 05/08/2018   Lab Results  Component Value Date   TRIG 160 (H) 05/08/2018   Lab Results  Component Value Date   CHOLHDL 4.8 05/08/2018   No results found for: HGBA1C    Assessment & Plan:   Problem List Items Addressed This Visit      Other   Depression with anxiety - Primary   Relevant Medications   PARoxetine (PAXIL) 20 MG tablet   QUEtiapine (SEROQUEL) 25 MG tablet      Meds ordered this encounter  Medications  . PARoxetine (PAXIL) 20 MG tablet    Sig: Take 1 tablet (20 mg total) by mouth daily.    Dispense:  90 tablet    Refill:  1  . QUEtiapine (SEROQUEL) 25 MG tablet    Sig: Take 1 tablet (25 mg total) by mouth at bedtime.    Dispense:  30 tablet    Refill:  1    Follow-up: Return in about 1 month (around 09/06/2018), or if symptoms worsen or fail to improve.    Mliss SaxWilliam Alfred , MDVirtual Visit via Video Note  I connected with Tricia Solomon on 08/06/18 at  9:00 AM EDT by a video enabled telemedicine application and verified that I am speaking with the correct person using two identifiers.  Location: Patient: home Provider:    I discussed the limitations of evaluation and management by telemedicine and the availability of in person appointments. The patient expressed understanding and agreed to proceed.  History of Present Illness:    Observations/Objective:   Assessment and Plan:   Follow Up Instructions:    I discussed the assessment and treatment plan with the patient. The patient was provided an opportunity to ask questions and all were  answered. The patient agreed with the plan and demonstrated an understanding of the instructions.   The patient was advised to call back or seek  an in-person evaluation if the symptoms worsen or if the condition fails to improve as anticipated.  I provided 20 minutes of non-face-to-face time during this encounter.  We will begin Seroquel nightly for augmentation.  Continue Paxil at its current dose patient will follow-up in 1 month or sooner as needed.

## 2018-09-08 ENCOUNTER — Telehealth (INDEPENDENT_AMBULATORY_CARE_PROVIDER_SITE_OTHER): Payer: PRIVATE HEALTH INSURANCE | Admitting: Family Medicine

## 2018-09-08 ENCOUNTER — Other Ambulatory Visit: Payer: Self-pay

## 2018-09-08 ENCOUNTER — Encounter: Payer: Self-pay | Admitting: Family Medicine

## 2018-09-08 DIAGNOSIS — F418 Other specified anxiety disorders: Secondary | ICD-10-CM

## 2018-09-08 DIAGNOSIS — Z3009 Encounter for other general counseling and advice on contraception: Secondary | ICD-10-CM | POA: Diagnosis not present

## 2018-09-08 DIAGNOSIS — Z87898 Personal history of other specified conditions: Secondary | ICD-10-CM | POA: Diagnosis not present

## 2018-09-08 LAB — POCT URINE PREGNANCY: Preg Test, Ur: NEGATIVE

## 2018-09-08 MED ORDER — QUETIAPINE FUMARATE 25 MG PO TABS: 25.0000 mg | ORAL_TABLET | Freq: Every day | ORAL | 1 refills | Status: DC

## 2018-09-08 MED ORDER — PAROXETINE HCL 20 MG PO TABS: 20.0000 mg | ORAL_TABLET | Freq: Every day | ORAL | 1 refills | Status: DC

## 2018-09-08 NOTE — Addendum Note (Signed)
Addended by: Marrion Coy on: 09/08/2018 01:40 PM   Modules accepted: Orders

## 2018-09-08 NOTE — Progress Notes (Signed)
Established Patient Office Visit  Subjective:  Patient ID: Tricia MassonHanna Heffley, female    DOB: 12/04/1995  Age: 23 y.o. MRN: [id number]  CC:  Chief Complaint  Patient presents with   thigh pain    HPI Tricia MassonHanna Solomon presents for follow-up of her anxiety and depression.  Patient has done well with the addition of the nighttime Seroquel.  Mood is elevated and those around her including her significant other and mother have noticed a difference.  She seems to have more energy and has been exercising by walking and riding a stationary exercise bike.  Her appetite has been under better control.  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  She has been having side pain that radiates from her lateral flanks towards her groin.  It is affected both sides but not at the same time.  Pain lasts for seconds.  She denies any hematuria or change in her urine habits.  Denies constipation or changes in her stooling habits.  She believes that both parents have suffered from kidney stones.  She is currently taking nothing for contraception.  Recent pregnancy test was negative.  But has been concerned that oral contraceptives and Paxil's have led to weight gain in the past.  Would be open to alternative such as an IUD.  She remains in a stable relationship.  Past Medical History:  Diagnosis Date   Depression     History reviewed. No pertinent surgical history.  History reviewed. No pertinent family history.  Social History   Socioeconomic History   Marital status: Single    Spouse name: Not on file   Number of children: Not on file   Years of education: Not on file   Highest education level: Not on file  Occupational History   Not on file  Social Needs   Financial resource strain: Not on file   Food insecurity    Worry: Not on file    Inability: Not on file   Transportation needs    Medical: Not on file    Non-medical: Not on file  Tobacco Use   Smoking status:  Current Every Day Smoker   Smokeless tobacco: Never Used  Substance and Sexual Activity   Alcohol use: Yes   Drug use: Yes   Sexual activity: Yes  Lifestyle   Physical activity    Days per week: Not on file    Minutes per session: Not on file   Stress: Not on file  Relationships   Social connections    Talks on phone: Not on file    Gets together: Not on file    Attends religious service: Not on file    Active member of club or organization: Not on file    Attends meetings of clubs or organizations: Not on file    Relationship status: Not on file   Intimate partner violence    Fear of current or ex partner: Not on file    Emotionally abused: Not on file    Physically abused: Not on file    Forced sexual activity: Not on file  Other Topics Concern   Not on file  Social History Narrative   Not on file    Outpatient Medications Prior to Visit  Medication Sig Dispense Refill   sodium chloride (OCEAN) 0.65 % SOLN nasal spray Place 1 spray into both nostrils as needed for congestion. 1 Bottle 0   PARoxetine (PAXIL) 20 MG tablet Take 1 tablet (20 mg  total) by mouth daily. 90 tablet 1   QUEtiapine (SEROQUEL) 25 MG tablet Take 1 tablet (25 mg total) by mouth at bedtime. 30 tablet 1   No facility-administered medications prior to visit.     No Known Allergies  ROS Review of Systems  Constitutional: Negative.   HENT: Negative.   Respiratory: Negative.   Cardiovascular: Negative.   Gastrointestinal: Negative for abdominal distention, abdominal pain, anal bleeding, blood in stool, constipation, diarrhea, nausea and vomiting.  Endocrine: Negative for polyphagia and polyuria.  Genitourinary: Positive for flank pain. Negative for difficulty urinating, dysuria, frequency, hematuria, urgency, vaginal discharge and vaginal pain.  Skin: Negative.   Neurological: Negative.   Hematological: Does not bruise/bleed easily.      Depression screen [facility] 2/9 09/08/2018 08/06/2018  07/10/2018  Decreased Interest 0 3 1  Down, Depressed, Hopeless 0 2 0  PHQ - 2 Score 0 5 1  Altered sleeping 0 0 0  Tired, decreased energy 0 2 1  Change in appetite 1 0 0  Feeling bad or failure about yourself  0 2 0  Trouble concentrating 0 0 0  Moving slowly or fidgety/restless 0 0 0  Suicidal thoughts 0 1 0  PHQ-9 Score 1 10 2       Objective:    Physical Exam  There were no vitals taken for this visit. Wt Readings from Last 3 Encounters:  05/08/18 202 lb (91.6 kg)  04/14/18 197 lb 8 oz (89.6 kg)   BP Readings from Last 3 Encounters:  06/05/18 120/70  05/08/18 128/80  04/14/18 124/80   Guideline developer:  UpToDate (see UpToDate for funding source) Date Released: June 2014  Health Maintenance Due  Topic Date Due   CHLAMYDIA SCREENING  06/23/2012   HIV Screening  06/23/2012   TETANUS/TDAP  06/23/2016   PAP-Cervical Cytology Screening  06/24/2018   PAP SMEAR-Modifier  06/24/2018    There are no preventive care reminders to display for this patient.  No results found for: TSH Lab Results  Component Value Date   WBC 10.2 05/08/2018   HGB 14.1 05/08/2018   HCT 41.4 05/08/2018   MCV 92.2 05/08/2018   PLT 281 05/08/2018   Lab Results  Component Value Date   NA 138 05/08/2018   K 4.0 05/08/2018   CO2 24 05/08/2018   GLUCOSE 78 05/08/2018   BUN 9 05/08/2018   CREATININE 0.66 05/08/2018   BILITOT 1.0 05/08/2018   AST 11 05/08/2018   ALT 13 05/08/2018   PROT 6.9 05/08/2018   CALCIUM 9.0 05/08/2018   Lab Results  Component Value Date   CHOL 263 (H) 05/08/2018   Lab Results  Component Value Date   HDL 55 05/08/2018   Lab Results  Component Value Date   LDLCALC 177 (H) 05/08/2018   Lab Results  Component Value Date   TRIG 160 (H) 05/08/2018   Lab Results  Component Value Date   CHOLHDL 4.8 05/08/2018   No results found for: HGBA1C    Assessment & Plan:   Problem List Items Addressed This Visit      Other   Depression with anxiety  - Primary   Relevant Medications   PARoxetine (PAXIL) 20 MG tablet   QUEtiapine (SEROQUEL) 25 MG tablet   Encounter for counseling regarding contraception   Relevant Orders   Ambulatory referral to Gynecology   H/O bilateral flank pain   Relevant Orders   Urinalysis, Routine w reflex microscopic   POCT urine pregnancy  Virtual Visit via Video Note  I connected with Tricia MassonHanna Braaten on 09/08/18 at  9:00 AM EDT by a video enabled telemedicine application and verified that I am speaking with the correct person using two identifiers.  Location: Patient: home Provider:    I discussed the limitations of evaluation and management by telemedicine and the availability of in person appointments. The patient expressed understanding and agreed to proceed.  History of Present Illness:    Observations/Objective:   Assessment and Plan:   Follow Up Instructions:    I discussed the assessment and treatment plan with the patient. The patient was provided an opportunity to ask questions and all were answered. The patient agreed with the plan and demonstrated an understanding of the instructions.   The patient was advised to call back or seek an in-person evaluation if the symptoms worsen or if the condition fails to improve as anticipated.  I provided 20 minutes of non-face-to-face time during this encounter.   Mliss SaxWilliam Alfred Jacelynn Hayton, MD  Meds ordered this encounter  Medications   PARoxetine (PAXIL) 20 MG tablet    Sig: Take 1 tablet (20 mg total) by mouth daily.    Dispense:  90 tablet    Refill:  1   QUEtiapine (SEROQUEL) 25 MG tablet    Sig: Take 1 tablet (25 mg total) by mouth at bedtime.    Dispense:  90 tablet    Refill:  1    Follow-up: No follow-ups on file.   Patient will follow-up in 3 months or sooner as needed.  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  Agrees to go to GYN for counseling and well woman care.  Follow-up for above ordered labs.

## 2018-09-09 LAB — URINALYSIS, ROUTINE W REFLEX MICROSCOPIC
Bilirubin Urine: NEGATIVE
Glucose, UA: NEGATIVE
Hgb urine dipstick: NEGATIVE
Ketones, ur: NEGATIVE
Leukocytes,Ua: NEGATIVE
Nitrite: NEGATIVE
Protein, ur: NEGATIVE
Specific Gravity, Urine: 1.023 (ref 1.001–1.03)
pH: >=8.5 — AB (ref 5.0–8.0)

## 2018-09-25 ENCOUNTER — Ambulatory Visit: Payer: PRIVATE HEALTH INSURANCE | Admitting: Gynecology

## 2018-09-25 ENCOUNTER — Other Ambulatory Visit: Payer: Self-pay

## 2018-09-29 ENCOUNTER — Ambulatory Visit: Payer: PRIVATE HEALTH INSURANCE | Admitting: Gynecology

## 2018-09-29 DIAGNOSIS — Z0289 Encounter for other administrative examinations: Secondary | ICD-10-CM

## 2018-10-13 ENCOUNTER — Ambulatory Visit (INDEPENDENT_AMBULATORY_CARE_PROVIDER_SITE_OTHER): Payer: PRIVATE HEALTH INSURANCE

## 2018-10-13 ENCOUNTER — Other Ambulatory Visit: Payer: Self-pay

## 2018-10-13 DIAGNOSIS — Z3400 Encounter for supervision of normal first pregnancy, unspecified trimester: Secondary | ICD-10-CM

## 2018-10-13 DIAGNOSIS — Z3401 Encounter for supervision of normal first pregnancy, first trimester: Secondary | ICD-10-CM

## 2018-10-13 HISTORY — DX: Encounter for supervision of normal first pregnancy, unspecified trimester: Z34.00

## 2018-10-13 LAB — POCT URINE PREGNANCY: Preg Test, Ur: POSITIVE — AB

## 2018-10-13 MED ORDER — BLOOD PRESSURE KIT: PACK | 0 refills | Status: DC

## 2018-10-13 NOTE — Progress Notes (Signed)
Tricia Solomon presents today for UPT. She c/o one incident of brown vaginal spotting 4 days ago after wiping. Denies cramping, denies itching/odor.   LMP: 08/25/2018    OBJECTIVE: Appears well, in no apparent distress.  OB History    Gravida  1   Para      Term      Preterm      AB      Living        SAB      TAB      Ectopic      Multiple      Live Births             Home UPT Result: POSITIVE  In-Office UPT result: POSITIVE  I have reviewed the patient's medical, obstetrical, social, and family histories, and medications.   ASSESSMENT: Positive pregnancy test  PLAN Prenatal care to be completed at:  [facility]  If spotting returns and/or worsens, to contact the office or report to [facility]

## 2018-10-13 NOTE — Progress Notes (Signed)
Patient seen and assessed by nursing staff during this encounter. I have reviewed the chart and agree with the documentation and plan.  Mora Bellman, MD 10/13/2018 11:52 AM

## 2018-11-03 ENCOUNTER — Other Ambulatory Visit: Payer: Self-pay

## 2018-11-03 ENCOUNTER — Ambulatory Visit: Payer: Medicaid Other

## 2018-11-03 DIAGNOSIS — Z3400 Encounter for supervision of normal first pregnancy, unspecified trimester: Secondary | ICD-10-CM

## 2018-11-03 NOTE — Progress Notes (Signed)
Patient seen and assessed by nursing staff during this encounter. I have reviewed the chart and agree with the documentation and plan.  Mora Bellman, MD 11/03/2018 3:28 PM

## 2018-11-03 NOTE — Progress Notes (Signed)
  Virtual Visit via Telephone Note  I connected with Tricia Solomon on 11/03/18 at  1:45 PM EDT by telephone and verified that I am speaking with the correct person using two identifiers.  Location:[facility] Patient: Tricia Solomon Provider: New OB Intake   I discussed the limitations, risks, security and privacy concerns of performing an evaluation and management service by telephone and the availability of in person appointments. I also discussed with the patient that there may be a patient responsible charge related to this service. The patient expressed understanding and agreed to proceed.   History of Present Illness: PRENATAL INTAKE SUMMARY  Ms. Subia presents today New OB Nurse Interview.  OB History    Gravida  1   Para      Term      Preterm      AB      Living  0     SAB      TAB      Ectopic      Multiple      Live Births             I have reviewed the patient's medical, obstetrical, social, and family histories, medications, and available lab results.  SUBJECTIVE She has no unusual complaints   Observations/Objective: Initial nurse interview for history (New OB)  EDD: 06-01-19 GA: [redacted]w[redacted]d GP: G1P0  GENERAL APPEARANCE: alert, well appearing  Assessment and Plan: Normal pregnancy Patient reports that she is no longer spotting, denies any pain. Pt received babyscripts link but did not receive BP cuff, advised that we would re-send. NOB appt 11-10-18.   Follow Up Instructions:   I discussed the assessment and treatment plan with the patient. The patient was provided an opportunity to ask questions and all were answered. The patient agreed with the plan and demonstrated an understanding of the instructions.   The patient was advised to call back or seek an in-person evaluation if the symptoms worsen or if the condition fails to improve as anticipated.  I provided 15 minutes of non-face-to-face time during this encounter.   Hinton Lovely,  RN

## 2018-11-10 ENCOUNTER — Other Ambulatory Visit (HOSPITAL_COMMUNITY)
Admission: RE | Admit: 2018-11-10 | Discharge: 2018-11-10 | Disposition: A | Discharge status: Home / Self Care | Payer: Medicaid Other | Source: Ambulatory Visit | Attending: Medical | Admitting: Medical

## 2018-11-10 ENCOUNTER — Other Ambulatory Visit: Payer: Self-pay

## 2018-11-10 ENCOUNTER — Ambulatory Visit (INDEPENDENT_AMBULATORY_CARE_PROVIDER_SITE_OTHER): Payer: Medicaid Other | Admitting: Medical

## 2018-11-10 ENCOUNTER — Encounter: Payer: Self-pay | Admitting: Medical

## 2018-11-10 ENCOUNTER — Encounter: Payer: Self-pay | Admitting: Obstetrics

## 2018-11-10 VITALS — BP 117/84 | HR 102 | Wt 213.0 lb

## 2018-11-10 DIAGNOSIS — Z3400 Encounter for supervision of normal first pregnancy, unspecified trimester: Secondary | ICD-10-CM

## 2018-11-10 DIAGNOSIS — O99210 Obesity complicating pregnancy, unspecified trimester: Secondary | ICD-10-CM

## 2018-11-10 DIAGNOSIS — Z3A11 11 weeks gestation of pregnancy: Secondary | ICD-10-CM

## 2018-11-10 DIAGNOSIS — Z3481 Encounter for supervision of other normal pregnancy, first trimester: Secondary | ICD-10-CM

## 2018-11-10 DIAGNOSIS — Z3689 Encounter for other specified antenatal screening: Secondary | ICD-10-CM

## 2018-11-10 NOTE — Patient Instructions (Addendum)
Childbirth Education Options: [facility] Department Classes:  Childbirth education classes can help you get ready for a positive parenting experience. You can also meet other expectant parents and get free stuff for your baby. Each class runs for five weeks on the same night and costs $45 for the mother-to-be and her support person. Medicaid covers the cost if you are eligible. Call [phone] to register. [facility] Childbirth Education:  [phone] or [phone] or [email or web address]  Baby & Me Class: Discuss newborn & infant parenting and family adjustment issues with other new mothers in a relaxed environment. Each week brings a new speaker or baby-centered activity. We encourage new mothers to join [place] every Thursday at 11:00am. Babies birth until crawling. No registration or fee. Daddy [employer]: This course offers Dads-to-be the tools and knowledge needed to feel confident on their journey to becoming new fathers. Experienced dads, who have been trained as coaches, teach dads-to-be how to hold, comfort, diaper, swaddle and play with their infant while being able to support the new mom as well. A class for men taught by men. $25/dad Big Brother/Big Sister: Let your children share in the joy of a new brother or sister in this special class designed just for them. Class includes discussion about how families care for babies: swaddling, holding, diapering, safety as well as how they can be helpful in their new role. This class is designed for children ages 41 to 76, but any age is welcome. Please register each child individually. $5/child  Mom Talk: This mom-led group offers support and connection to mothers as they journey through the adjustments and struggles of that sometimes overwhelming first year after the birth of a child. Tuesdays at 10:00am and Thursdays at 6:00pm. Babies welcome. No registration or fee. Breastfeeding Support Group: This group is a mother-to-mother  support circle where moms have the opportunity to share their breastfeeding experiences. A Lactation Consultant is present for questions and concerns. Meets each Tuesday at 11:00am. No fee or registration. Breastfeeding Your Baby: Learn what to expect in the first days of breastfeeding your newborn.  This class will help you feel more confident with the skills needed to begin your breastfeeding experience. Many new mothers are concerned about breastfeeding after leaving the hospital. This class will also address the most common fears and challenges about breastfeeding during the first few weeks, months and beyond. (call for fee) Comfort Techniques and Tour: This 2 hour interactive class will provide you the opportunity to learn & practice hands-on techniques that can help relieve some of the discomfort of labor and encourage your baby to rotate toward the best position for birth. You and your partner will be able to try a variety of labor positions with birth balls and rebozos as well as practice breathing, relaxation, and visualization techniques. A tour of the [facility] is included with this class. $20 per registrant and support person Childbirth Class- Weekend Option: This class is a Weekend version of our Birth & Baby series. It is designed for parents who have a difficult time fitting several weeks of classes into their schedule. It covers the care of your newborn and the basics of labor and childbirth. It also includes a [facility] and lunch. The class is held two consecutive days: beginning on Friday evening from 6:30 - 8:30 p.m. and the next day, Saturday from 9 a.m. - 4 p.m. (call for fee) Doren Custard Class: Interested in a waterbirth?  This  informational class will help you discover whether waterbirth is the right fit for you. Education about waterbirth itself, supplies you would need and how to assemble your support team is what you can  expect from this class. Some obstetrical practices require this class in order to pursue a waterbirth. (Not all obstetrical practices offer waterbirth-check with your healthcare provider.) Register only the expectant mom, but you are encouraged to bring your partner to class! Required if planning waterbirth, no fee. °Infant/Child CPR: Parents, grandparents, babysitters, and friends learn Cardio-Pulmonary Resuscitation skills for infants and children. You will also learn how to treat both conscious and unconscious choking in infants and children. This Family & Friends program does not offer certification. Register each participant individually to ensure that enough mannequins are available. (Call for fee) °Grandparent Love: Expecting a grandbaby? This class is for you! Learn about the latest infant care and safety recommendations and ways to support your own child as he or she transitions into the parenting role. Taught by Registered Nurses who are childbirth instructors, but most importantly...they are grandmothers too! $10/person. °Childbirth Class- Natural Childbirth: This series of 5 weekly classes is for expectant parents who want to learn and practice natural methods of coping with the process of labor and childbirth. Relaxation, breathing, massage, visualization, role of the partner, and helpful positioning are highlighted. Participants learn how to be confident in their body's ability to give birth. This class will empower and help parents make informed decisions about their own care. Includes discussion that will help new parents transition into the immediate postpartum period. Maternity Care Center Tour of [facility] is included. We suggest taking this class between 25-32 weeks, but it's only a recommendation. $75 per registrant and one support person or $30 Medicaid. °Childbirth Class- 3 week Series: This option of 3 weekly classes helps you and your labor partner prepare for childbirth. Newborn  care, labor & birth, cesarean birth, pain management, and comfort techniques are discussed and a Maternity Care Center Tour of [facility] is included. The class meets at the same time, on the same day of the week for 3 consecutive weeks beginning with the starting date you choose. $60 for registrant and one support person.  °Marvelous Multiples: Expecting twins, triplets, or more? This class covers the differences in labor, birth, parenting, and breastfeeding issues that face multiples’ parents. NICU tour is included. Led by a Certified Childbirth Educator who is the mother of twins. No fee. °Caring for Baby: This class is for expectant and adoptive parents who want to learn and practice the most up-to-date newborn care for their babies. Focus is on birth through the first six weeks of life. Topics include feeding, bathing, diapering, crying, umbilical cord care, circumcision care and safe sleep. Parents learn to recognize symptoms of illness and when to call the pediatrician. Register only the mom-to-be and your partner or support person can plan to come with you! $10 per registrant and support person °Childbirth Class- online option: This online class offers you the freedom to complete a Birth and Baby series in the comfort of your own home. The flexibility of this option allows you to review sections at your own pace, at times convenient to you and your support people. It includes additional video information, animations, quizzes, and extended activities. Get organized with helpful eClass tools, checklists, and trackers. Once you register online for the class, you will receive an email within a few days to accept the invitation and begin the class when the time   is right for you. The content will be available to you for 60 days. $60 for 60 days of online access for you and your support people. ° °Local Doulas: °Natural Baby Doulas °[email or web address] °Tel:  336-267-5879 °https://[email or web address]/ °[facility]mont Doulas °336-448-4114 °[email or web address] °www.[email or web address].com °The Labor Ladies  °(also do waterbirth tub rental) °336-515-0240 °[email or web address] °https://www.thelaborladies.[email or web address]/ °Triad Birth Doula °336-312-4678 °[email or web address] °http://www.[email or web address]/ °Sacred Rhythms  °336-239-2124 °https://sacred-rhythms.com/ °[place] [employer] Doula Association (PADA) °[email or web address] °http://www.padanc.org/index.htm °[place] Birth and Baby  °http://[email or web address]/ °Considering Waterbirth? °Guide for [facility] at [facility] ° °Why consider waterbirth? ° °• Gentle birth for babies °• Less pain medicine used in labor °• May allow for passive descent/less pushing °• May reduce perineal tears  °• More mobility and instinctive maternal position changes °• Increased maternal relaxation °• Reduced blood pressure in labor ° °Is waterbirth safe? What are the risks of infection, drowning or other complications? ° °• Infection: °o Very low risk (3.7 % for tub vs 4.8% for bed) °o 7 in 8000 waterbirths with documented infection °o Poorly cleaned equipment most common cause °o Slightly lower group B strep transmission rate ° °• Drowning °o Maternal:  °- Very low risk   °- Related to seizures or fainting °o Newborn:  °- Very low risk. No evidence of increased risk of respiratory problems in multiple large studies °- Physiological protection from breathing under water °- Avoid underwater birth if there are any fetal complications °- Once baby’s head is out of the water, keep it out. ° °• Birth complication °o Some reports of cord trauma, but risk decreased by bringing baby to surface gradually °o No evidence of increased risk of shoulder dystocia. Mothers can usually change positions faster in water than in a bed, possibly aiding the maneuvers to free the shoulder. ° ° °You must attend a [facility] class [facility] °· 3rd Wednesday of every month from 7-9pm °· Free °· Register by calling [phone] or online at [email or web address] °· Bring us the certificate from the class to your prenatal appointment ° °Meet with a midwife at 36 weeks to see if you can still plan a waterbirth and to sign the consent.  ° °Purchase or rent the following supplies:  °· Water Birth Pool (Birth Pool in a Box or [place] for instance)  (Tubs start ~$125) °· Single-use disposable tub liner designed for your brand of tub °· New garden hose labeled "lead-free", “suitable for drinking water", °· Electric drain pump to remove water (We recommend 792 gallon per hour or greater pump.)  °· Separate garden hose to remove the dirty water °· Fish net °· Bathing suit top (optional) °· Long-handled mirror (optional) ° °Places to purchase or rent supplies °· Yourwaterbirth.com for tub purchases and supplies °· Waterbirthsolutions.com for tub purchases and supplies °· The Labor Ladies (www.thelaborladies.com) $275 for tub rental/set-up & take down/kit  °· [place] [employer] Doul[employer] Association (http://www.padanc.org/MeetUs.htm) Information regarding doulas (labor support) who provide pool rentals °· Our practice has a Birth Pool in a Box tub at the hospital that you may borrow on a first-come-first-served basis. It is your responsibility to to set up, clean and break down the tub. We cannot guarantee the availability of this tub in advance. You are responsible for bringing all accessories listed above. If you do not have all necessary supplies you cannot have a waterbirth.   ° °Things that would prevent you from having a waterbirth: °· Premature, <37wks °· Previous cesarean birth °· Presence of thick meconium-stained fluid °· Multiple gestation (Twins,   triplets, etc.)  Uncontrolled diabetes or gestational diabetes requiring medication  Hypertension requiring medication or diagnosis of pre-eclampsia  Heavy vaginal bleeding  Non-reassuring fetal  heart rate  Active infection (MRSA, etc.). Group B Strep is NOT a contraindication for  waterbirth.  If your labor has to be induced and induction method requires continuous  monitoring of the baby's heart rate  Other risks/issues identified by your obstetrical provider  Please remember that birth is unpredictable. Under certain unforeseeable circumstances your provider may advise against giving birth in the tub. These decisions will be made on a case-by-case basis and with the safety of you and your baby as our highest priority.  AREA PEDIATRIC/FAMILY PRACTICE PHYSICIANS  [place] [phone])  [facility] Davy Pique, MD; Gwendlyn Deutscher, MD; Walker Kehr, MD; Andria Frames, MD; McDiarmid, MD; Dutch Quint, MD; Nori Riis, MD; Mingo Amber, [address] [place], [place] [address] o [phone] o Mon-Fri 8:30-12:30, 1:30-5:00 o Providers come to see babies at [place] [facility] [place] providers who accept newborns: Dorthy Cooler, MD; Orland Mustard, MD; Stephanie Acre, MD o [place], [place], [place] [address] o [phone] o Mon-Fri 8:00-5:30 o Babies seen by providers at [facility] o Does NOT accept Medicaid o Please call early in hospitalization for appointment (limited availability)   [place], MD o [address], [place], [place] [address] o [phone] o Mon, Tue, Thur, Fri 8:30-5:00, Wed 10:00-7:00 (closed 1-2pm) o Babies seen by [facility] providers o Accepting Medicaid  [place], MD o [place], [place], [place] [address] o [phone] o Mon-Fri 8:30-5:00, Sat 8:30-12:00 o Provider comes to see babies at [facility] o Accepting Medicaid o Must have been referred from current patients or contacted office prior to delivery  [place] [facility] ([place] [facility]  [place]) Franne Forts, MD; Tamera Punt, MD; Doneen Poisson, MD; Fatima Sanger, MD; Wynetta Emery, MD; Jess Barters, MD; Tami Ribas, MD; Herbert Moors, MD; Derrell Lolling, MD; Dorothyann Peng, MD; Lucious Groves, NP; Baldo Ash, NP o [place], [place], [place] [address] o [phone] o Mon, Tue, Thur, Fri 8:30-5:30, Wed 9:30-5:30, Sat 8:30-12:30 o Babies seen by [facility] providers o Accepting Medicaid o Only accepting infants of first-time parents or siblings of current patients o Hospital discharge coordinator will make follow-up appointment  Baltazar Najjar o [address], [place], [place]  [address] o [phone]   Fax - [phone]  [facility] o [address], [place], [place]  [address] o Phone - [phone]   Fax - [place] o [address] [place], [place], [place]  [address] o [phone]  [place]heast [place] [phone])  [facility] Reginal Lutes, MD; Jacklynn Ganong, MD; Torrie Mayers, MD; MD; Rosana Hoes, MD; Servando Salina, MD; Rose Fillers, MD; Rex Kras, MD; Corinna Capra, MD; Volney American, MD; Trilby Drummer, MD; Janann Colonel, MD; Jimmye Norman, [place], [place], [place] [address] o [phone] o Mon-Fri 8:30-5:00 (extended evenings Mon-Thur as needed), Sat-Sun 10:00-1:00 o Providers come to see babies at [place] Medicaid for families of first-time babies and families with all children in the household age 43 and under. Must register with office prior to making appointment (M-F only).  [place], NP; Tomi Bamberger, MD; Redmond School, MD; [place], [address] [place],  [address] o [phone] o Mon-Fri 8:00-5:00 o Babies seen by providers at [facility] o Does NOT accept Medicaid/Commercial  Insurance Only  [facility] [place] ([facility])  Marnee Guarneri, MD; Drema Dallas, MD; Montine Circle, MD; Vilma Prader, MD; Vanita Panda, MD; Alfonso Ramus, MD; Ruthann Cancer, MD; Roxanne Mins, MD; Rosalva Ferron, MD; Polly Cobia, MD o [address]  [place], [place] [address] o [phone] o Mon-Fri 8:30-5:30, Sat (Oct.-Mar.) 9:00-1:00 o Babies seen by providers at [place] [phone])  [facility] Peers, MD; Suzan Slick, MD o [address] [address], [place], [place] [address] o [phone] o Mon-Fri 8:30-5:00, Sat 8:30-12:00 o Providers come to see babies at [facility] o Does NOT accept Medicaid  [place] at [place], [place]; Mannie Stabile, MD; [place], [place]; Nancy Fetter, MD; Moreen Fowler, [place], [place], [place] [address] o [phone] o Mon-Fri 8:00-5:00 o Babies seen by providers at [facility] o Does NOT accept Medicaid o Only accepting babies of parents who are patients o Please call early in hospitalization for appointment (limited availability)  [facility] Pediatricians Blanca Friend, MD; Sharlene Motts, MD; Rod Can, MD; Warner Mccreedy, NP; Sabra Heck, MD; Ermalinda Memos, MD; Sharlett Iles, NP; Aurther Loft, MD; Jerrye Beavers, MD; Marcello Moores, MD; Berline Lopes, MD; Charolette Forward, MD o [place], [place], [place] [address] o [phone] o Mon-Fri 8:00-5:00, Sat 9:00-12:00 o Providers come to see babies at [facility] o Does NOT accept [facility] [phone])  [place] [facility] [place] providers accepting new patients: Dayna Ramus, NP; [place], [place], [place], [place] [address] o [phone] o Mon-Fri 8:00-5:00 o Babies seen by providers at [facility] o Does NOT accept Medicaid o Only accepting babies of parents who are patients o Please call early in hospitalization for appointment (limited availability)  [facility] Oswaldo Conroy, MD; Sheran Lawless, [address] [place], [place] [address] o [phone] (press 1 to schedule appointment) o Mon-Fri 8:00-5:00 o Providers come to see babies at [facility] o Does NOT accept [facility] Jodi Mourning, MD o [address] [place], [place]  [address] o [phone] o Mon-Fri 8:30-5:00 (lunch 12:30-1:00), extended hours by appointment only Wed 5:00-6:30 o Babies seen by [place] Medicaid  [place] at Evalyn Casco, MD; [place], MD; Ethlyn Gallery, MD o [place], [place], [place] [address] o [phone] o Mon-Fri 8:00-5:00 o Babies seen by [facility] providers o Does NOT accept Medicaid  [place] at [place], MD; Yong Channel, MD; [place], [address] [place], [place] [address] o [phone] o Mon-Fri 8:00-5:00 o Babies seen by [facility] providers o Does NOT accept [facility]  [place], [place]; [place], [place]; [place], NP; Albertina Parr, MD; Frederic Jericho, MD; Ronney Lion, MD; Carlos Levering, NP; Jerelene [place], NP; Tomasita Crumble, NP; Ronelle Nigh, NP; Corinna Lines, MD; Karsten Ro, MD o [address] [place], [place] [address] o [phone] o Mon-Fri 8:30-5:00, Sat 10:00-1:00 o Providers come to see babies at [facility] o Does NOT accept Medicaid o Free prenatal information session Tuesdays at 4:45pm  [place], MD; [place], [place]; [place], [place]; [place], [place]., [place] [address] o [phone] o Mon-Fri 7:30-5:30 o Babies seen by [place] Doctor o [address] [place], [place], [place]  [address] o [phone]   Fax - [phone]  [place] [phone] & [phone])  [place], MD o [address] [place], [place] [address] o [phone] o Mon-Thur 8:00-6:00  o Providers come to see babies at [place], NP; [place] Aase, MD; [place], [place]; [place], [address] [place], [place] [address] o [phone] o Mon-Thur 7:30-7:30, Fri 7:30-4:30 o Babies seen by [facility] providers o Accepting [facility] Nyra Jabs, MD; Cristino Martes,  NP; Gertie Baron, MD o [place] [address], [place], [place] [address] o [phone] o Mon-Fri 8:30-5:00, Sat 8:30-12:00 o Providers come to see babies at [facility] o Accepting Medicaid o Must have Meet & Greet appointment at office prior to delivery  [place] ([place]) Jodene Nam, MD; Juleen China, MD; Clydene Laming, [place] [address], [place], [place] [address] o [phone] o Mon-Wed 8:00-6:00, Thur-Fri 8:00-5:00, Sat 9:00-12:00 o Providers come to see babies at [facility] o Does NOT accept Medicaid o Only accepting siblings of current patients  [facility]  o [address] [place], [place], [place]  [address] o [phone]   Fax - [phone]  [place] [facility] o [phone] [place], [place]  [address] o [phone]   Fax - [place] [phone] & [phone])  Therapist, music at [place], [place]; [place], [address] [place], [place] [address] o [phone] o Mon-Fri 7:00-5:00 o Babies seen by [facility] providers o Does NOT accept Medicaid  [place], MD; [place], [place]; [place], [place] [address], [place], [place] [address] o [phone] o Mon-Fri 8:00-5:00 o Babies seen by [facility] providers o Accepting Medicaid  [place], MD; [place], [place]; [place], NP; [place], [place], [place], [place] [address] o [phone] o Mon-Fri 8:00-5:00 o Babies seen by providers at [place] [phone])  [facility] at [place], [place] o [address]., [place], [place] [address] o [phone] o Mon-Fri 8:00-5:00 o Babies seen by [facility] providers o Does NOT accept  Medicaid o Limited availability, please call early in hospitalization to schedule follow-up  [facility] Leilani Merl, [place]; Maisie Fus, MD; Charlesetta Garibaldi, MD; [place], [place]; Jeannine Kitten, MD; [place], [place] [facility] [address], [place], [place] [address] o [phone] o Mon-Fri 8:30-5:00, Sat 9:00-12:00 o Babies seen by providers at [place] Medicaid o Please register online then schedule online or call office o [email or web address] ([place] [facility] [place]) Kristian Covey, NP; Dwyane Dee, MD; Leonidas Romberg, PA o [address], [place], [place] [address] o [phone] o Mon-Fri 8:00-5:00 o Babies seen by providers at [place] Medicaid  [place] ([facility]) Dairl Ponder, MD; Rayvon Char, NP; Melina Modena, MD o [address] [place], [place], [place] [address] o [phone] o Mon-Fri 8:00-5:30, Sat&Sun by appointment (phones open at 8:30) o Babies seen by [facility] providers o Accepting Medicaid o Must be a first-time baby or sibling of current patient  [place], [address], [place], [place]  [address] o [phone]   Fax - [phone]  [place] [phone] & [phone])  [place], [place]; [place], [place]; Benjamine Mola, MD; [place], [place]; Harrell Lark, MD o [address],  [place], [place] [address] o [phone] o Mon-Thur 8:00-7:00, Fri 8:00-5:00, Sat 8:00-12:00, Sun 9:00-12:00 o Babies seen by [facility] providers o Accepting Medicaid  [facility] at [facility] [place], MD; Ruthann Cancer, MD; [facility], MD o [address] [place], [place], [place] [address] o [phone] o Mon-Thur 8:00-5:00 o Babies seen by providers at [place] Medicaid  [facility] [place] [facility] [place], MD; [place], MD; Amedeo Plenty,  MD; Bobby Rumpf, MD; List, MD; Lavonia Drafts, MD; Ruthann Cancer, MD; Selinda Eon, MD; Audie Box, MD; Jim Like, MD; Christie Nottingham, MD; Hubbard Hartshorn, MD; Modena Nunnery, MD o [place] [place], [place] [address] o [phone] o Mon-Fri 8:00-5:30, Sat (Oct.-Mar.) 9:00-1:00 o Babies seen by providers at [facility] o Accepting Medicaid o Must fill out new patient packet, available online at [email or web address]  [place] ([facility]) Barnabas Lister, NP; Kenton Kingfisher, NP; Claiborne Billings, NP; Rolla Plate, MD; [place], [place]; Carola Rhine, MD; Tyron Russell, MD; Delia Chimes, NP o [address], [place], [place] [id number] o [phone] o Mon-Thur 8:00-5:30, Fri 8:00-5:00 o Babies seen by providers at [place] [phone])  [place], [place]; [place], [place]; [place], [place]; [place], [place] o [address] [place], [place] [address] o [phone] o Mon-Fri 8:00-5:00 o Babies seen by providers at [place] [phone])  [place] at [place], [place]; Olen Pel, MD; [place], [place], [place], [place] [address] o [phone] o Mon-Fri 8:00-5:00 o Babies seen by providers at [facility] o Does NOT accept Medicaid o Limited appointment availability, please call early in hospitalization   [place] at [place], [place]; [place], [place] o [address] [place], [place] [address] o [phone] o Mon-Fri 8:00-5:00 o Babies seen by [facility] providers o Does NOT accept [facility] Su Grand, MD; Guy Sandifer, MD; [place], [place]; [address], [place], [place], [place] [address] o [phone] o Mon-Fri 8:00-5:00 o After hours clinic [facility] [place], [place] [address]) [phone] Mon-Fri 5:00-8:00, Sat 12:00-6:00, Sun 10:00-4:00 o Babies seen by [facility]  providers o Accepting Medicaid  [place] at [facility] o [address] [place], [place]  [address] o [phone]   Fax - [phone]  [place] [phone])  [place] at [place], [place] o [address] [place] [address] [place], [place], [place] [address] o [phone] o Mon-Fri 8:00-5:00 o Babies seen by [facility] providers o Does NOT accept Medicaid  [place] ([place], [place] o [address] [place] [address] [place], [place] [address] o [phone] o Mon-Thur 8:00-7:00, Fri 8:00-5:00, Sat 8:00-12:00 o Babies seen by providers at [facility] o Accepting Medicaid - but does not have vaccinations in office (must be received elsewhere) o Limited availability, please call early in hospitalization  [place] [phone])  [place], [place] o [address] [place] [address] o [phone]  Fax [phone]   First Trimester of Pregnancy  The first trimester of pregnancy is from week 1 until the end of week 13 (months 1 through 3). During this time, your baby will begin to develop inside you. At 6-8 weeks, the eyes and face are formed, and the  heartbeat can be seen on ultrasound. At the end of 12 weeks, all the baby's organs are formed. Prenatal care is all the medical care you receive before the birth of your baby. Make sure you get good prenatal care and follow all of your doctor's instructions. Follow these instructions at home: Medicines  Take over-the-counter and prescription medicines only as told by your doctor. Some medicines are safe and some medicines are not safe during pregnancy.  Take a prenatal vitamin that contains at least 600 micrograms (mcg) of folic acid.  If you have trouble pooping (constipation), take medicine that will make your stool soft (stool softener) if your doctor approves. Eating and drinking   Eat regular, healthy  meals.  Your doctor will tell you the amount of weight gain that is right for you.  Avoid raw meat and uncooked cheese.  If you feel sick to your stomach (nauseous) or throw up (vomit): ? Eat 4 or 5 small meals a day instead of 3 large meals. ? Try eating a few soda crackers. ? Drink liquids between meals instead of during meals.  To prevent constipation: ? Eat foods that are high in fiber, like fresh fruits and vegetables, whole grains, and beans. ? Drink enough fluids to keep your pee (urine) clear or pale yellow. Activity  Exercise only as told by your doctor. Stop exercising if you have cramps or pain in your lower belly (abdomen) or low back.  Do not exercise if it is too hot, too humid, or if you are in a place of great height (high altitude).  Try to avoid standing for long periods of time. Move your legs often if you must stand in one place for a long time.  Avoid heavy lifting.  Wear low-heeled shoes. Sit and stand up straight.  You can have sex unless your doctor tells you not to. Relieving pain and discomfort  Wear a good support bra if your breasts are sore.  Take warm water baths (sitz baths) to soothe pain or discomfort caused by hemorrhoids. Use hemorrhoid cream if your doctor says it is okay.  Rest with your legs raised if you have leg cramps or low back pain.  If you have puffy, bulging veins (varicose veins) in your legs: ? Wear support hose or compression stockings as told by your doctor. ? Raise (elevate) your feet for 15 minutes, 3-4 times a day. ? Limit salt in your food. Prenatal care  Schedule your prenatal visits by the twelfth week of pregnancy.  Write down your questions. Take them to your prenatal visits.  Keep all your prenatal visits as told by your doctor. This is important. Safety  Wear your seat belt at all times when driving.  Make a list of emergency phone numbers. The list should include numbers for family, friends, the hospital,  and police and fire departments. General instructions  Ask your doctor for a referral to a local prenatal class. Begin classes no later than at the start of month 6 of your pregnancy.  Ask for help if you need counseling or if you need help with nutrition. Your doctor can give you advice or tell you where to go for help.  Do not use hot tubs, steam rooms, or saunas.  Do not douche or use tampons or scented sanitary pads.  Do not cross your legs for long periods of time.  Avoid all herbs and alcohol. Avoid drugs that are not approved by your doctor.  Do not use any  tobacco products, including cigarettes, chewing tobacco, and electronic cigarettes. If you need help quitting, ask your doctor. You may get counseling or other support to help you quit.  Avoid cat litter boxes and soil used by cats. These carry germs that can cause birth defects in the baby and can cause a loss of your baby (miscarriage) or stillbirth.  Visit your dentist. At home, brush your teeth with a soft toothbrush. Be gentle when you floss. Contact a doctor if:  You are dizzy.  You have mild cramps or pressure in your lower belly.  You have a nagging pain in your belly area.  You continue to feel sick to your stomach, you throw up, or you have watery poop (diarrhea).  You have a bad smelling fluid coming from your vagina.  You have pain when you pee (urinate).  You have increased puffiness (swelling) in your face, hands, legs, or ankles. Get help right away if:  You have a fever.  You are leaking fluid from your vagina.  You have spotting or bleeding from your vagina.  You have very bad belly cramping or pain.  You gain or lose weight rapidly.  You throw up blood. It may look like coffee grounds.  You are around people who have [place] measles, fifth disease, or chickenpox.  You have a very bad headache.  You have shortness of breath.  You have any kind of trauma, such as from a fall or a car  accident. Summary  The first trimester of pregnancy is from week 1 until the end of week 13 (months 1 through 3).  To take care of yourself and your unborn baby, you will need to eat healthy meals, take medicines only if your doctor tells you to do so, and do activities that are safe for you and your baby.  Keep all follow-up visits as told by your doctor. This is important as your doctor will have to ensure that your baby is healthy and growing well. This information is not intended to replace advice given to you by your health care provider. Make sure you discuss any questions you have with your health care provider. Document Released: 08/28/2007 Document Revised: 07/02/2018 Document Reviewed: 03/19/2016 Elsevier Patient Education  2020 [place] Medications in Pregnancy   Acne:  Benzoyl Peroxide  Salicylic Acid   Backache/Headache:  Tylenol: 2 regular strength every 4 hours OR        2 Extra strength every 6 hours   Colds/Coughs/Allergies:  Benadryl (alcohol free) 25 mg every 6 hours as needed  Breath right strips  Claritin  Cepacol throat lozenges  Chloraseptic throat spray  Cold-Eeze- up to three times per day  Cough drops, alcohol free  Flonase (by prescription only)  Guaifenesin  Mucinex  Robitussin DM (plain only, alcohol free)  Saline nasal spray/drops  Sudafed (pseudoephedrine) & Actifed * use only after [redacted] weeks gestation and if you do not have high blood pressure  Tylenol  Vicks Vaporub  Zinc lozenges  Zyrtec   Constipation:  Colace  Ducolax suppositories  Fleet enema  Glycerin suppositories  Metamucil  Milk of magnesia  Miralax  Senokot  Smooth move tea   Diarrhea:  Kaopectate  Imodium A-D   *NO pepto Bismol   Hemorrhoids:  Anusol  Anusol HC  Preparation H  Tucks   Indigestion:  Tums  Maalox  Mylanta  Zantac  Pepcid   Insomnia:  Benadryl (alcohol free) '25mg'$  every 6 hours as needed  Tylenol  PM  Unisom, no Gelcaps    Leg Cramps:  Tums  MagGel   Nausea/Vomiting:  Bonine  Dramamine  Emetrol  Ginger extract  Sea bands  Meclizine  Nausea medication to take during pregnancy:  Unisom (doxylamine succinate 25 mg tablets) Take one tablet daily at bedtime. If symptoms are not adequately controlled, the dose can be increased to a maximum recommended dose of two tablets daily (1/2 tablet in the morning, 1/2 tablet mid-afternoon and one at bedtime).  Vitamin B6 '100mg'$  tablets. Take one tablet twice a day (up to 200 mg per day).   Skin Rashes:  Aveeno products  Benadryl cream or '25mg'$  every 6 hours as needed  Calamine Lotion  1% cortisone cream   Yeast infection:  Gyne-lotrimin 7  Monistat 7    **If taking multiple medications, please check labels to avoid duplicating the same active ingredients  **take medication as directed on the label  ** Do not exceed 4000 mg of tylenol in 24 hours  **Do not take medications that contain aspirin or ibuprofen

## 2018-11-10 NOTE — Progress Notes (Signed)
   PRENATAL VISIT NOTE  Subjective:  Tricia Solomon is a 23 y.o. G1P0 at [redacted]w[redacted]d being seen today for her first prenatal care visit.  She is currently monitored for the following issues for this low-risk pregnancy and has Depression with anxiety and Supervision of normal first pregnancy, antepartum on their problem list.  Patient reports no complaints.  Contractions: Not present. Vag. Bleeding: None.  Movement: Absent. Denies leaking of fluid.   The following portions of the patient's history were reviewed and updated as appropriate: allergies, current medications, past family history, past medical history, past social history, past surgical history and problem list.   Objective:   Vitals:   11/10/18 1021  BP: 117/84  Pulse: (!) 102  Weight: 213 lb (96.6 kg)    Fetal Status:     Movement: Absent    Unable to obtain FHR with doppler. US performed in office today to confirm viability by Elyn Peers. SIUP noted with FHR 172-181 bpm. +FM noted.   General:  Alert, oriented and cooperative. Patient is in no acute distress.  Skin: Skin is warm and dry. No rash noted.   Cardiovascular: Normal heart rate and rhythm noted  Respiratory: Normal respiratory effort, no problems with respiration noted. Clear to auscultation.   Abdomen: Soft, gravid, appropriate for gestational age. Non-tender. Pain/Pressure: Absent     Pelvic: Cervical exam performed Dilation: Closed Effacement (%): Thick    Small amount of thin, white, mucous discharge noted. Pap smear obtained. No bleeding following pap. Normal cervical contour. No lesions.  Breast: Symmetric, no masses. No nipple discharge.   Extremities: Normal range of motion.  Edema: None  Mental Status: Normal mood and affect. Normal behavior. Normal judgment and thought content.   Assessment and Plan:  Pregnancy: G1P0 at [redacted]w[redacted]d 1. Supervision of normal first pregnancy, antepartum -  Patient is taking PNV regularly  - Cytology - PAP( [facility]) - Culture,  OB Urine - Genetic Screening - [facility] only today due to early gestation and maternal obesity  - Obstetric Panel, Including HIV - [place] MFM OB COMP + 30 WK; Future  2. Encounter for fetal anatomic survey - Scheduled for [redacted] weeks gestation  - [place] MFM OB COMP + 73 WK; Future  3. Obesity in pregnancy - HgB A1c  Discussed nature of our practice with multiple providers and coverage for delivery at [facility] Discussed cadence for prenatal visits with Baby Rx optimization as patient is a candidate for this scheduled. Also discussed the possibility of virtual visits.   Preterm labor/first trimester symptoms and general obstetric precautions including but not limited to vaginal bleeding, contractions, leaking of fluid and fetal movement were reviewed in detail with the patient. Please refer to After Visit Summary for other counseling recommendations.   Return in about 8 weeks (around 01/05/2019) for LOB, Virtual.  Future Appointments  Date Time Provider [place]  01/05/2019 10:30 AM [facility] [place]    Kerry Hough, PA-C

## 2018-11-10 NOTE — Addendum Note (Signed)
Addended by: Tristan Schroeder D on: 11/10/2018 01:33 PM   Modules accepted: Orders

## 2018-11-10 NOTE — Progress Notes (Signed)
Patient is in the office for initial ob visit. New OB intake interview previously completed on 11-03-18. Pt denies any pain or issues today.

## 2018-11-11 LAB — HEMOGLOBIN A1C
Est. average glucose Bld gHb Est-mCnc: 97 mg/dL
Hgb A1c MFr Bld: 5 % (ref 4.8–5.6)

## 2018-11-11 LAB — OBSTETRIC PANEL, INCLUDING HIV
Antibody Screen: NEGATIVE
Basophils Absolute: 0.1 10*3/uL (ref 0.0–0.2)
Basos: 1 %
EOS (ABSOLUTE): 0.1 10*3/uL (ref 0.0–0.4)
Eos: 1 %
HIV Screen 4th Generation wRfx: NONREACTIVE
Hematocrit: 40.7 % (ref 34.0–46.6)
Hemoglobin: 13.9 g/dL (ref 11.1–15.9)
Hepatitis B Surface Ag: NEGATIVE
Immature Grans (Abs): 0 10*3/uL (ref 0.0–0.1)
Immature Granulocytes: 0 %
Lymphocytes Absolute: 2 10*3/uL (ref 0.7–3.1)
Lymphs: 25 %
MCH: 31.2 pg (ref 26.6–33.0)
MCHC: 34.2 g/dL (ref 31.5–35.7)
MCV: 91 fL (ref 79–97)
Monocytes Absolute: 0.5 10*3/uL (ref 0.1–0.9)
Monocytes: 7 %
Neutrophils Absolute: 5.1 10*3/uL (ref 1.4–7.0)
Neutrophils: 66 %
Platelets: 258 10*3/uL (ref 150–450)
RBC: 4.46 x10E6/uL (ref 3.77–5.28)
RDW: 12.5 % (ref 11.7–15.4)
RPR Ser Ql: NONREACTIVE
Rh Factor: POSITIVE
Rubella Antibodies, IGG: 2.72 index (ref >0.99)
WBC: 7.8 10*3/uL (ref 3.4–10.8)

## 2018-11-11 LAB — CERVICOVAGINAL ANCILLARY ONLY
Bacterial vaginitis: NEGATIVE
Candida vaginitis: NEGATIVE

## 2018-11-12 LAB — CYTOLOGY - PAP
Chlamydia: NEGATIVE
Diagnosis: NEGATIVE
Neisseria Gonorrhea: NEGATIVE

## 2018-11-13 LAB — URINE CULTURE, OB REFLEX

## 2018-11-13 LAB — CULTURE, OB URINE

## 2018-11-23 ENCOUNTER — Encounter: Payer: Self-pay | Admitting: Medical

## 2018-12-01 ENCOUNTER — Encounter: Payer: Self-pay | Admitting: Obstetrics

## 2018-12-01 ENCOUNTER — Other Ambulatory Visit: Payer: Medicaid Other

## 2018-12-01 ENCOUNTER — Other Ambulatory Visit: Payer: Self-pay

## 2018-12-01 DIAGNOSIS — Z3400 Encounter for supervision of normal first pregnancy, unspecified trimester: Secondary | ICD-10-CM

## 2018-12-09 DIAGNOSIS — O0990 Supervision of high risk pregnancy, unspecified, unspecified trimester: Secondary | ICD-10-CM | POA: Diagnosis not present

## 2018-12-14 ENCOUNTER — Encounter: Payer: Self-pay | Admitting: Gynecology

## 2018-12-18 ENCOUNTER — Encounter (HOSPITAL_COMMUNITY): Payer: Self-pay

## 2018-12-18 ENCOUNTER — Other Ambulatory Visit: Payer: Self-pay

## 2018-12-18 ENCOUNTER — Ambulatory Visit (HOSPITAL_COMMUNITY)
Admission: EM | Admit: 2018-12-18 | Discharge: 2018-12-18 | Disposition: A | Discharge status: Home / Self Care | Payer: Medicaid Other | Attending: Emergency Medicine | Admitting: Emergency Medicine

## 2018-12-18 DIAGNOSIS — O26892 Other specified pregnancy related conditions, second trimester: Secondary | ICD-10-CM | POA: Insufficient documentation

## 2018-12-18 DIAGNOSIS — O99212 Obesity complicating pregnancy, second trimester: Secondary | ICD-10-CM | POA: Insufficient documentation

## 2018-12-18 DIAGNOSIS — O99342 Other mental disorders complicating pregnancy, second trimester: Secondary | ICD-10-CM | POA: Insufficient documentation

## 2018-12-18 DIAGNOSIS — Z3492 Encounter for supervision of normal pregnancy, unspecified, second trimester: Secondary | ICD-10-CM

## 2018-12-18 DIAGNOSIS — Z3A16 16 weeks gestation of pregnancy: Secondary | ICD-10-CM | POA: Diagnosis not present

## 2018-12-18 DIAGNOSIS — E669 Obesity, unspecified: Secondary | ICD-10-CM | POA: Diagnosis not present

## 2018-12-18 DIAGNOSIS — F329 Major depressive disorder, single episode, unspecified: Secondary | ICD-10-CM | POA: Insufficient documentation

## 2018-12-18 DIAGNOSIS — Z20828 Contact with and (suspected) exposure to other viral communicable diseases: Secondary | ICD-10-CM | POA: Insufficient documentation

## 2018-12-18 DIAGNOSIS — R0681 Apnea, not elsewhere classified: Secondary | ICD-10-CM

## 2018-12-18 DIAGNOSIS — R0602 Shortness of breath: Secondary | ICD-10-CM | POA: Insufficient documentation

## 2018-12-18 NOTE — Discharge Instructions (Addendum)
Your exam is reassuring today.  Pregnancy can lead to some sensation of breathlessness.  Try to do light exercise regularly to combat this.  Your blood pressure is slightly elevated tonight as well.  I would watch this daily for the next week to see trends. If it is over 140/90 regularly please reach out to your OB.  If you develop chest pain , swelling or  pain to lower legs, difficulty breathing, headache, vision changes or blurred vision please go to the women's hospital.

## 2018-12-18 NOTE — ED Provider Notes (Signed)
[place]    CSN: [id number] Arrival date & time: 12/18/18  2353      History   Chief Complaint Chief Complaint  Patient presents with  . Shortness of Breath    HPI Tricia Solomon is a 23 y.o. female.   Tricia Solomon presents with complaints of shortness of breath . Working from home, she feels like she is having to catch her breath and feels breathlessness. Comes and goes. Can last 2-3 minutes. Noticed yesterday and today. No chest pain. No fevers. No cough or congestion. Some sneezing. Received blood pressure cuff recently to monitor blood pressure weekly at home, last reading of  134/82.  Patient is [redacted] weeks pregnant. No increased swelling. No chest pain . No headache, no vision changes. No known ill contacts. No history of asthma. History of smoking, quit once pregnant. No dyspnea on exertion. Primarily shortness of breath noted with speaking, works for a call center. Next OB 10/13.    History  Of depression, anxiety, obesity.     ROS per HPI, negative if not otherwise mentioned.      Past Medical History:  Diagnosis Date  . Depression     Patient Active Problem List   Diagnosis Date Noted  . Obesity in pregnancy 11/10/2018  . Supervision of normal first pregnancy, antepartum 10/13/2018  . Depression with anxiety 05/08/2018    History reviewed. No pertinent surgical history.  OB History    Gravida  1   Para      Term      Preterm      AB      Living  0     SAB      TAB      Ectopic      Multiple      Live Births               Home Medications    Prior to Admission medications   Medication Sig Start Date End Date Taking? Authorizing Provider  Blood Pressure KIT MONITOR BP READINGS AT HOME REGULARLY O09.90 LARGE Patient not taking: Reported on 11/03/2018 10/13/18   Constant, Peggy, MD  PARoxetine (PAXIL) 20 MG tablet Take 1 tablet (20 mg total) by mouth daily. Patient not taking: Reported on 10/13/2018 09/08/18   Libby Maw, MD  Prenatal Vit-Fe Fumarate-FA (MULTIVITAMIN-PRENATAL) 27-0.8 MG TABS tablet Take 1 tablet by mouth daily at 12 noon.    [provider]  pyridoxine (B-6) 100 MG tablet Take 100 mg by mouth daily.    [provider]  QUEtiapine (SEROQUEL) 25 MG tablet Take 1 tablet (25 mg total) by mouth at bedtime. Patient not taking: Reported on 10/13/2018 09/08/18   Libby Maw, MD  sodium chloride (OCEAN) 0.65 % SOLN nasal spray Place 1 spray into both nostrils as needed for congestion. Patient not taking: Reported on 10/13/2018 04/14/18   Libby Maw, MD    Family History Family History  Family history unknown: Yes    Social History Social History   Tobacco Use  . Smoking status: Former Research scientist (life sciences)  . Smokeless tobacco: Never Used  Substance Use Topics  . Alcohol use: Not Currently  . Drug use: Yes     Allergies   Patient has no known allergies.   Review of Systems Review of Systems   Physical Exam Triage Vital Signs ED Triage Vitals [12/18/18 1959]  Enc Vitals Group     BP (!) 147/97     Pulse Rate  86     Resp 16     Temp 98.6 F (37 C)     Temp Source Oral     SpO2 100 %     Weight      Height      Head Circumference      Peak Flow      Pain Score 0     Pain Loc      Pain Edu?      Excl. in [place]?    No data found.  Updated Vital Signs BP 117/77   Pulse 86   Temp 98.6 F (37 C) (Oral)   Resp 16   LMP 08/25/2018   SpO2 100%   Visual Acuity Right Eye Distance:   Left Eye Distance:   Bilateral Distance:    Right Eye Near:   Left Eye Near:    Bilateral Near:     Physical Exam Constitutional:      General: She is not in acute distress.    Appearance: She is well-developed.  Cardiovascular:     Rate and Rhythm: Normal rate and regular rhythm.     Heart sounds: Normal heart sounds.  Pulmonary:     Effort: Pulmonary effort is normal.     Breath sounds: Normal breath sounds.  Musculoskeletal:     Comments:  Calves are soft, non tender, negative homans   Skin:    General: Skin is warm and dry.  Neurological:     Mental Status: She is alert and oriented to person, place, and time.      UC Treatments / Results  Labs (all labs ordered are listed, but only abnormal results are displayed) Labs Reviewed  NOVEL CORONAVIRUS, NAA (HOSP ORDER, SEND-OUT TO REF LAB; TAT 18-24 HRS)    EKG   Radiology No results found.  Procedures Procedures (including critical care time)  Medications Ordered in UC Medications - No data to display  Initial Impression / Assessment and Plan / UC Course  I have reviewed the triage vital signs and the nursing notes.  Pertinent labs & imaging results that were available during my care of the patient were reviewed by me and considered in my medical decision making (see chart for details).     Non toxic, no increased work of breathing. No cough or chest pain . No fevers. No calf pain. No hypoxia or tachycardia. Noted elevation in BP, has had normal readings at home. On recheck BP much improved and WNL.  I suspect her breathlessness is normal hemodynamic changes for this pregnant patient. Return precautions provided. Patient verbalized understanding and agreeable to plan.  Ambulatory out of clinic without difficulty.    Final Clinical Impressions(s) / UC Diagnoses   Final diagnoses:  Breathlessness  Second trimester pregnancy     Discharge Instructions     Your exam is reassuring today.  Pregnancy can lead to some sensation of breathlessness.  Try to do light exercise regularly to combat this.  Your blood pressure is slightly elevated tonight as well.  I would watch this daily for the next week to see trends. If it is over 140/90 regularly please reach out to your OB.  If you develop chest pain , swelling or  pain to lower legs, difficulty breathing, headache, vision changes or blurred vision please go to the women's hospital.     ED Prescriptions     None     PDMP not reviewed this encounter.   Zigmund Gottron, NP 12/18/18 2033

## 2018-12-18 NOTE — ED Triage Notes (Signed)
Pt presents with shortness of breath since yesterday. 

## 2018-12-20 LAB — NOVEL CORONAVIRUS, NAA (HOSP ORDER, SEND-OUT TO REF LAB; TAT 18-24 HRS): SARS-CoV-2, NAA: NOT DETECTED

## 2018-12-21 ENCOUNTER — Encounter (HOSPITAL_COMMUNITY): Payer: Self-pay

## 2019-01-05 ENCOUNTER — Telehealth (INDEPENDENT_AMBULATORY_CARE_PROVIDER_SITE_OTHER): Payer: Medicaid Other | Admitting: Obstetrics

## 2019-01-05 ENCOUNTER — Other Ambulatory Visit: Payer: Self-pay | Admitting: Medical

## 2019-01-05 ENCOUNTER — Encounter: Payer: Self-pay | Admitting: Obstetrics

## 2019-01-05 ENCOUNTER — Other Ambulatory Visit: Payer: Self-pay

## 2019-01-05 ENCOUNTER — Ambulatory Visit (HOSPITAL_COMMUNITY)
Admission: RE | Admit: 2019-01-05 | Discharge: 2019-01-05 | Disposition: A | Discharge status: Home / Self Care | Payer: Medicaid Other | Source: Ambulatory Visit | Attending: Medical | Admitting: Medical

## 2019-01-05 ENCOUNTER — Other Ambulatory Visit (HOSPITAL_COMMUNITY): Payer: Self-pay | Admitting: *Deleted

## 2019-01-05 VITALS — BP 133/81 | HR 100

## 2019-01-05 DIAGNOSIS — O99210 Obesity complicating pregnancy, unspecified trimester: Secondary | ICD-10-CM

## 2019-01-05 DIAGNOSIS — Z362 Encounter for other antenatal screening follow-up: Secondary | ICD-10-CM

## 2019-01-05 DIAGNOSIS — Z3689 Encounter for other specified antenatal screening: Secondary | ICD-10-CM

## 2019-01-05 DIAGNOSIS — E669 Obesity, unspecified: Secondary | ICD-10-CM

## 2019-01-05 DIAGNOSIS — Z3400 Encounter for supervision of normal first pregnancy, unspecified trimester: Secondary | ICD-10-CM | POA: Diagnosis not present

## 2019-01-05 DIAGNOSIS — O99212 Obesity complicating pregnancy, second trimester: Secondary | ICD-10-CM

## 2019-01-05 DIAGNOSIS — Z3A19 19 weeks gestation of pregnancy: Secondary | ICD-10-CM | POA: Diagnosis not present

## 2019-01-05 NOTE — Progress Notes (Addendum)
S/w pt for mychart visit, pt reports fetal flutter movements with occasional pressure.

## 2019-01-05 NOTE — Progress Notes (Signed)
TELEHEALTH OBSTETRICS PRENATAL VIRTUAL VIDEO VISIT ENCOUNTER NOTE  Provider location: [facility] for [facility] [place]   I connected with [name] on 01/05/19 at  3:00 PM EDT by OB MyChart Video Encounter at home and verified that I am speaking with the correct person using two identifiers.   I discussed the limitations, risks, security and privacy concerns of performing an evaluation and management service virtually and the availability of in person appointments. I also discussed with the patient that there may be a patient responsible charge related to this service. The patient expressed understanding and agreed to proceed. Subjective:  Tricia Solomon is a 23 y.o. G1P0 at [redacted]w[redacted]d being seen today for ongoing prenatal care.  She is currently monitored for the following issues for this low-risk pregnancy and has Depression with anxiety; Supervision of normal first pregnancy, antepartum; and Obesity in pregnancy on their problem list.  Patient reports no complaints.  Contractions: Not present. Vag. Bleeding: None.  Movement: Present. Denies any leaking of fluid.   The following portions of the patient's history were reviewed and updated as appropriate: allergies, current medications, past family history, past medical history, past social history, past surgical history and problem list.   Objective:   Vitals:   01/05/19 1501  BP: 133/81  Pulse: 100    Fetal Status:     Movement: Present     General:  Alert, oriented and cooperative. Patient is in no acute distress.  Respiratory: Normal respiratory effort, no problems with respiration noted  Mental Status: Normal mood and affect. Normal behavior. Normal judgment and thought content.  Rest of physical exam deferred due to type of encounter  Imaging: [place] Mfm Ob Detail +14 Wk  Result Date: 01/05/2019 ----------------------------------------------------------------------  OBSTETRICS REPORT                       (Signed Final  01/05/2019 01:52 pm) ---------------------------------------------------------------------- Patient Info  ID #:       [id number]                          D.O.B.:  January 16, 1996 (23 yrs)  Name:       [name]                    Visit Date: 01/05/2019 10:40 am ---------------------------------------------------------------------- Performed By  Performed By:     Sandi Mealy        Ref. Address:     [address] [place],                                                             [place] [address]  Attending:        Lin Landsman      [facility]                                       [facility]  Referred By:      Marny LowensteinJULIE N WENZEL                    PA ---------------------------------------------------------------------- Orders   #  Description                          Code         Ordered By   1  US MFM OB DETAIL +14 WK              76811.01     Vonzella NippleJULIE WENZEL  ----------------------------------------------------------------------   #  Order #                    Accession #                 Episode #   1  [id number]                  [phone]                  [id number]  ---------------------------------------------------------------------- Indications   Encounter for antenatal screening for          Z36.3   malformations (Low Risk NIPS)   Obesity complicating pregnancy (BMI 37)        O99.210 E66.9   [redacted] weeks gestation of pregnancy                Z3A.19  ---------------------------------------------------------------------- Vital Signs  Weight (lb): 213                               Height:        5'3"  BMI:         37.73 ---------------------------------------------------------------------- Fetal Evaluation  Num Of Fetuses:         1  Fetal Heart Rate(bpm):  135  Cardiac Activity:       Observed  Presentation:           Variable  Placenta:               Posterior  P. Cord Insertion:       Visualized  Amniotic Fluid  AFI FV:      Within normal limits                              Largest Pocket(cm)                              5.21 ---------------------------------------------------------------------- Biometry  BPD:      42.2  mm     G. Age:  18w 5d         41  %    CI:        73.09   %    70 - 86  FL/HC:      16.0   %    16.1 - 18.3  HC:      156.9  mm     G. Age:  18w 4d         24  %    HC/AC:      1.21        1.09 - 1.39  AC:      130.1  mm     G. Age:  18w 4d         31  %    FL/BPD:     59.5   %  FL:       25.1  mm     G. Age:  17w 4d          6  %    FL/AC:      19.3   %    20 - 24  HUM:      23.9  mm     G. Age:  17w 3d        < 5  %  CER:      18.7  mm     G. Age:  18w 2d         31  %  NFT:       4.2  mm  LV:        8.1  mm  CM:        3.7  mm  Est. FW:     227  gm      0 lb 8 oz      9  % ---------------------------------------------------------------------- OB History  Gravidity:    1         Term:   0  Living:       0 ---------------------------------------------------------------------- Gestational Age  LMP:           19w 0d        Date:  08/25/18                 EDD:   06/01/19  U/S Today:     18w 3d                                        EDD:   06/05/19  Best:          19w 0d     Det. By:  LMP  (08/25/18)          EDD:   06/01/19 ---------------------------------------------------------------------- Anatomy  Cranium:               Appears normal         Aortic Arch:            Appears normal  Cavum:                 Appears normal         Ductal Arch:            Appears normal  Ventricles:            Appears normal         Diaphragm:              Appears normal  Choroid Plexus:        Bilateral choroid      Stomach:  Appears normal, left                         plexus cysts                                                                        sided  Cerebellum:            Appears normal         Abdomen:                 Appears normal  Posterior Fossa:       Appears normal         Abdominal Wall:         Appears nml (cord                                                                        insert, abd wall)  Nuchal Fold:           Appears normal         Cord Vessels:           Appears normal (3                                                                        vessel cord)  Face:                  Appears normal         Kidneys:                Not well visualized                         (orbits and profile)  Lips:                  Appears normal         Bladder:                Appears normal  Thoracic:              Appears normal         Spine:                  Ltd views no                                                                        intracranial  signs of                                                                        NTD  Heart:                 Echogenic focus        Upper Extremities:      Appears normal                         in LV  RVOT:                  Not well visualized    Lower Extremities:      Appears normal  LVOT:                  Appears normal  Other:  Female gender. Nasal bone visualized. ---------------------------------------------------------------------- Cervix Uterus Adnexa  Cervix  Length:           4.37  cm.  Normal appearance by transabdominal scan.  Uterus  No abnormality visualized.  Left Ovary  Size(cm)     3.11   x   1.47   x  2.37      Vol(ml): 5.67  Within normal limits.  Right Ovary  Not visualized.  Cul De Sac  No free fluid seen.  Adnexa  No abnormality visualized. ---------------------------------------------------------------------- Impression  Normal interval growth.  No ultrasonic evidence of structural  fetal anomalies.  Low risk NIPS  Bilateral choroid plexus cyst  Echogenic intracardiac focus  Suboptimal views of the fetal anatomy was obtained  secondary to fetal position.  I reviewed the isolated findings of todays visit. I explained in  the context of a low risk cell  free DNA there is little clinical  significance. ---------------------------------------------------------------------- Recommendations  Follow up anatomy scheduled in 4 weeks. ----------------------------------------------------------------------               Sander Nephew, MD Electronically Signed Final Report   01/05/2019 01:52 pm ----------------------------------------------------------------------   Assessment and Plan:  Pregnancy: G1P0 at [redacted]w[redacted]d 1. Supervision of normal first pregnancy, antepartum  2. Obesity in pregnancy   Preterm labor symptoms and general obstetric precautions including but not limited to vaginal bleeding, contractions, leaking of fluid and fetal movement were reviewed in detail with the patient. I discussed the assessment and treatment plan with the patient. The patient was provided an opportunity to ask questions and all were answered. The patient agreed with the plan and demonstrated an understanding of the instructions. The patient was advised to call back or seek an in-person office evaluation/go to MAU at [facility] for any urgent or concerning symptoms. Please refer to After Visit Summary for other counseling recommendations.   I provided 10 minutes of face-to-face time during this encounter.  No follow-ups on file.  Future Appointments  Date Time Provider [place]  02/02/2019  9:45 AM [place] NURSE [place] MFC-US  02/02/2019  9:45 AM [place] 2 WH-MFCUS MFC-US    Baltazar Najjar, [place] for [facility], [facility] 01/05/2019

## 2019-02-02 ENCOUNTER — Telehealth (INDEPENDENT_AMBULATORY_CARE_PROVIDER_SITE_OTHER): Payer: Medicaid Other | Admitting: Obstetrics

## 2019-02-02 ENCOUNTER — Encounter: Payer: Self-pay | Admitting: Obstetrics

## 2019-02-02 ENCOUNTER — Ambulatory Visit (HOSPITAL_COMMUNITY): Payer: Medicaid Other

## 2019-02-02 DIAGNOSIS — Z3402 Encounter for supervision of normal first pregnancy, second trimester: Secondary | ICD-10-CM

## 2019-02-02 DIAGNOSIS — Z3A23 23 weeks gestation of pregnancy: Secondary | ICD-10-CM

## 2019-02-02 DIAGNOSIS — Z3400 Encounter for supervision of normal first pregnancy, unspecified trimester: Secondary | ICD-10-CM

## 2019-02-02 NOTE — Progress Notes (Signed)
Virtual Visit via Telephone Note  I connected with Tricia Solomon on 02/02/19 at  3:00 PM EST by telephone and verified that I am speaking with the correct person using two identifiers.  No concerns per pt

## 2019-02-02 NOTE — Progress Notes (Signed)
TELEHEALTH OBSTETRICS PRENATAL VIRTUAL VIDEO VISIT ENCOUNTER NOTE  Provider location: [facility] for [facility] [place]   I connected with Tricia Solomon on 02/02/19 at  3:00 PM EST by OB MyChart Video Encounter at home and verified that I am speaking with the correct person using two identifiers.   I discussed the limitations, risks, security and privacy concerns of performing an evaluation and management service virtually and the availability of in person appointments. I also discussed with the patient that there may be a patient responsible charge related to this service. The patient expressed understanding and agreed to proceed. Subjective:  Tricia Solomon is a 23 y.o. G1P0 at [redacted]w[redacted]d being seen today for ongoing prenatal care.  She is currently monitored for the following issues for this low-risk pregnancy and has Depression with anxiety; Supervision of normal first pregnancy, antepartum; and Obesity in pregnancy on their problem list.  Patient reports heartburn.  Contractions: Not present. Vag. Bleeding: None.  Movement: Present. Denies any leaking of fluid.   The following portions of the patient's history were reviewed and updated as appropriate: allergies, current medications, past family history, past medical history, past social history, past surgical history and problem list.   Objective:   Vitals:   02/02/19 1504  BP: 127/79  Pulse: 90    Fetal Status:     Movement: Present     General:  Alert, oriented and cooperative. Patient is in no acute distress.  Respiratory: Normal respiratory effort, no problems with respiration noted  Mental Status: Normal mood and affect. Normal behavior. Normal judgment and thought content.  Rest of physical exam deferred due to type of encounter  Imaging: [place] Mfm Ob Detail +14 Wk  Result Date: 01/05/2019 ----------------------------------------------------------------------  OBSTETRICS REPORT                       (Signed Final 01/05/2019  01:52 pm) ---------------------------------------------------------------------- Patient Info  ID #:       [id number]                          D.O.B.:  November 10, 1995 (23 yrs)  Name:       Tricia Solomon                    Visit Date: 01/05/2019 10:40 am ---------------------------------------------------------------------- Performed By  Performed By:     Sandi Mealy        Ref. Address:     [address] [place],                                                             [place] [address]  Attending:        Lin Landsman      [facility]                    MD                                       [facility] Care  Referred By:      Marny LowensteinJULIE N WENZEL                    PA ---------------------------------------------------------------------- Orders   #  Description                          Code         Ordered By   1  US MFM OB DETAIL +14 WK              76811.01     Vonzella NippleJULIE WENZEL  ----------------------------------------------------------------------   #  Order #                    Accession #                 Episode #   1  [id number]                  [id number]                  [id number]  ---------------------------------------------------------------------- Indications   Encounter for antenatal screening for          Z36.3   malformations (Low Risk NIPS)   Obesity complicating pregnancy (BMI 37)        O99.210 E66.9   [redacted] weeks gestation of pregnancy                Z3A.19  ---------------------------------------------------------------------- Vital Signs  Weight (lb): 213                               Height:        5'3"  BMI:         37.73 ---------------------------------------------------------------------- Fetal Evaluation  Num Of Fetuses:         1  Fetal Heart Rate(bpm):  135  Cardiac Activity:       Observed  Presentation:           Variable  Placenta:               Posterior  P. Cord Insertion:       Visualized  Amniotic Fluid  AFI FV:      Within normal limits                              Largest Pocket(cm)                              5.21 ---------------------------------------------------------------------- Biometry  BPD:      42.2  mm     G. Age:  18w 5d         41  %    CI:        73.09   %    70 - 86  FL/HC:      16.0   %    16.1 - 18.3  HC:      156.9  mm     G. Age:  18w 4d         24  %    HC/AC:      1.21        1.09 - 1.39  AC:      130.1  mm     G. Age:  18w 4d         31  %    FL/BPD:     59.5   %  FL:       25.1  mm     G. Age:  17w 4d          6  %    FL/AC:      19.3   %    20 - 24  HUM:      23.9  mm     G. Age:  17w 3d        < 5  %  CER:      18.7  mm     G. Age:  18w 2d         31  %  NFT:       4.2  mm  LV:        8.1  mm  CM:        3.7  mm  Est. FW:     227  gm      0 lb 8 oz      9  % ---------------------------------------------------------------------- OB History  Gravidity:    1         Term:   0  Living:       0 ---------------------------------------------------------------------- Gestational Age  LMP:           19w 0d        Date:  08/25/18                 EDD:   06/01/19  U/S Today:     18w 3d                                        EDD:   06/05/19  Best:          19w 0d     Det. By:  LMP  (08/25/18)          EDD:   06/01/19 ---------------------------------------------------------------------- Anatomy  Cranium:               Appears normal         Aortic Arch:            Appears normal  Cavum:                 Appears normal         Ductal Arch:            Appears normal  Ventricles:            Appears normal         Diaphragm:              Appears normal  Choroid Plexus:        Bilateral choroid      Stomach:  Appears normal, left                         plexus cysts                                                                        sided  Cerebellum:            Appears normal         Abdomen:                 Appears normal  Posterior Fossa:       Appears normal         Abdominal Wall:         Appears nml (cord                                                                        insert, abd wall)  Nuchal Fold:           Appears normal         Cord Vessels:           Appears normal (3                                                                        vessel cord)  Face:                  Appears normal         Kidneys:                Not well visualized                         (orbits and profile)  Lips:                  Appears normal         Bladder:                Appears normal  Thoracic:              Appears normal         Spine:                  Ltd views no                                                                        intracranial  signs of                                                                        NTD  Heart:                 Echogenic focus        Upper Extremities:      Appears normal                         in LV  RVOT:                  Not well visualized    Lower Extremities:      Appears normal  LVOT:                  Appears normal  Other:  Female gender. Nasal bone visualized. ---------------------------------------------------------------------- Cervix Uterus Adnexa  Cervix  Length:           4.37  cm.  Normal appearance by transabdominal scan.  Uterus  No abnormality visualized.  Left Ovary  Size(cm)     3.11   x   1.47   x  2.37      Vol(ml): 5.67  Within normal limits.  Right Ovary  Not visualized.  Cul De Sac  No free fluid seen.  Adnexa  No abnormality visualized. ---------------------------------------------------------------------- Impression  Normal interval growth.  No ultrasonic evidence of structural  fetal anomalies.  Low risk NIPS  Bilateral choroid plexus cyst  Echogenic intracardiac focus  Suboptimal views of the fetal anatomy was obtained  secondary to fetal position.  I reviewed the isolated findings of todays visit. I explained in  the context of a low risk cell  free DNA there is little clinical  significance. ---------------------------------------------------------------------- Recommendations  Follow up anatomy scheduled in 4 weeks. ----------------------------------------------------------------------               Lin Landsman, MD Electronically Signed Final Report   01/05/2019 01:52 pm ----------------------------------------------------------------------   Assessment and Plan:  Pregnancy: G1P0 at [redacted]w[redacted]d 1. Supervision of normal first pregnancy, antepartum   Preterm labor symptoms and general obstetric precautions including but not limited to vaginal bleeding, contractions, leaking of fluid and fetal movement were reviewed in detail with the patient. I discussed the assessment and treatment plan with the patient. The patient was provided an opportunity to ask questions and all were answered. The patient agreed with the plan and demonstrated an understanding of the instructions. The patient was advised to call back or seek an in-person office evaluation/go to MAU at [facility] for any urgent or concerning symptoms. Please refer to After Visit Summary for other counseling recommendations.   I provided 10 minutes of face-to-face time during this encounter.  Return in about 4 weeks (around 03/02/2019) for ROB, 2 hour OGTT.  Future Appointments  Date Time Provider Department Center  02/04/2019  7:45 AM [facility]-MFC [place] 5 WH-MFCUS MFC-US  02/04/2019  7:50 AM WH-MFC NURSE WH-MFC MFC-US    Coral Ceo, MD [facility] for [facility], [facility] 02/02/2019

## 2019-02-04 ENCOUNTER — Ambulatory Visit (HOSPITAL_COMMUNITY)
Admission: RE | Admit: 2019-02-04 | Discharge: 2019-02-04 | Disposition: A | Discharge status: Home / Self Care | Payer: Medicaid Other | Source: Ambulatory Visit | Attending: Maternal & Fetal Medicine | Admitting: Maternal & Fetal Medicine

## 2019-02-04 ENCOUNTER — Ambulatory Visit (HOSPITAL_COMMUNITY): Payer: Medicaid Other | Admitting: *Deleted

## 2019-02-04 ENCOUNTER — Encounter (HOSPITAL_COMMUNITY): Payer: Self-pay

## 2019-02-04 ENCOUNTER — Other Ambulatory Visit: Payer: Self-pay

## 2019-02-04 ENCOUNTER — Other Ambulatory Visit (HOSPITAL_COMMUNITY): Payer: Self-pay | Admitting: *Deleted

## 2019-02-04 DIAGNOSIS — E669 Obesity, unspecified: Secondary | ICD-10-CM | POA: Diagnosis not present

## 2019-02-04 DIAGNOSIS — O365920 Maternal care for other known or suspected poor fetal growth, second trimester, not applicable or unspecified: Secondary | ICD-10-CM

## 2019-02-04 DIAGNOSIS — Z3A23 23 weeks gestation of pregnancy: Secondary | ICD-10-CM

## 2019-02-04 DIAGNOSIS — Z362 Encounter for other antenatal screening follow-up: Secondary | ICD-10-CM | POA: Insufficient documentation

## 2019-02-04 DIAGNOSIS — O283 Abnormal ultrasonic finding on antenatal screening of mother: Secondary | ICD-10-CM

## 2019-02-04 DIAGNOSIS — O99212 Obesity complicating pregnancy, second trimester: Secondary | ICD-10-CM | POA: Diagnosis not present

## 2019-03-02 ENCOUNTER — Other Ambulatory Visit: Payer: Medicaid Other

## 2019-03-02 ENCOUNTER — Ambulatory Visit (INDEPENDENT_AMBULATORY_CARE_PROVIDER_SITE_OTHER): Payer: Medicaid Other | Admitting: Advanced Practice Midwife

## 2019-03-02 ENCOUNTER — Other Ambulatory Visit: Payer: Self-pay

## 2019-03-02 VITALS — BP 121/77 | HR 101 | Wt 221.8 lb

## 2019-03-02 DIAGNOSIS — O99210 Obesity complicating pregnancy, unspecified trimester: Secondary | ICD-10-CM

## 2019-03-02 DIAGNOSIS — Z3403 Encounter for supervision of normal first pregnancy, third trimester: Secondary | ICD-10-CM

## 2019-03-02 DIAGNOSIS — Z23 Encounter for immunization: Secondary | ICD-10-CM | POA: Diagnosis not present

## 2019-03-02 DIAGNOSIS — O99212 Obesity complicating pregnancy, second trimester: Secondary | ICD-10-CM

## 2019-03-02 DIAGNOSIS — Z3A27 27 weeks gestation of pregnancy: Secondary | ICD-10-CM

## 2019-03-02 DIAGNOSIS — O24419 Gestational diabetes mellitus in pregnancy, unspecified control: Secondary | ICD-10-CM

## 2019-03-02 DIAGNOSIS — Z3400 Encounter for supervision of normal first pregnancy, unspecified trimester: Secondary | ICD-10-CM

## 2019-03-02 NOTE — Addendum Note (Signed)
Addended by: Tristan Schroeder D on: 03/02/2019 10:40 AM   Modules accepted: Orders

## 2019-03-02 NOTE — Progress Notes (Signed)
   PRENATAL VISIT NOTE  Subjective:  Tricia Solomon is a 23 y.o. G1P0 at [redacted]w[redacted]d being seen today for ongoing prenatal care.  She is currently monitored for the following issues for this low-risk pregnancy and has Depression with anxiety; Supervision of normal first pregnancy, antepartum; and Obesity in pregnancy on their problem list.  Patient reports occasional contractions.  Contractions: Not present. Vag. Bleeding: None.  Movement: Present. Denies leaking of fluid.   The following portions of the patient's history were reviewed and updated as appropriate: allergies, current medications, past family history, past medical history, past social history, past surgical history and problem list.   Objective:   Vitals:   03/02/19 0850  BP: 121/77  Pulse: (!) 101  Weight: 221 lb 12.8 oz (100.6 kg)    Fetal Status: Fetal Heart Rate (bpm): 130   Movement: Present     General:  Alert, oriented and cooperative. Patient is in no acute distress.  Skin: Skin is warm and dry. No rash noted.   Cardiovascular: Normal heart rate noted  Respiratory: Normal respiratory effort, no problems with respiration noted  Abdomen: Soft, gravid, appropriate for gestational age.  Pain/Pressure: Present     Pelvic: Cervical exam deferred        Extremities: Normal range of motion.  Edema: None  Mental Status: Normal mood and affect. Normal behavior. Normal judgment and thought content.   Assessment and Plan:  Pregnancy: G1P0 at [redacted]w[redacted]d 1. Encounter for supervision of normal first pregnancy in third trimester --Doing well, mild occasional cramping at night.  Discussed third trimester changes of pregnancy.  --Rest/ice/heat/warm bath/Tylenol.  --Anticipatory guidance about next visits/weeks of pregnancy given. --Answered questions about TDAP vaccine.  Pt to have flu and TDAP injections today. - Glucose Tolerance, 2 Hours w/1 Hour - RPR - CBC - HIV antibody (with reflex) - Flu Vaccine QUAD 36+ mos IM (Fluarix, Quad  PF)  Preterm labor symptoms and general obstetric precautions including but not limited to vaginal bleeding, contractions, leaking of fluid and fetal movement were reviewed in detail with the patient. Please refer to After Visit Summary for other counseling recommendations.   Return in about 4 weeks (around 03/30/2019).  Future Appointments  Date Time Provider [place]  03/04/2019  9:45 AM [place] 5 WH-MFCUS MFC-US  03/04/2019  9:50 AM [place] NURSE [place] MFC-US    Fatima Blank, CNM

## 2019-03-02 NOTE — Patient Instructions (Signed)
Third Trimester of Pregnancy The third trimester is from week 28 through week 40 (months 7 through 9). The third trimester is a time when the unborn baby (fetus) is growing rapidly. At the end of the ninth month, the fetus is about 20 inches in length and weighs 6-10 pounds. Body changes during your third trimester Your body will continue to go through many changes during pregnancy. The changes vary from woman to woman. During the third trimester:  Your weight will continue to increase. You can expect to gain 25-35 pounds (11-16 kg) by the end of the pregnancy.  You may begin to get stretch marks on your hips, abdomen, and breasts.  You may urinate more often because the fetus is moving lower into your pelvis and pressing on your bladder.  You may develop or continue to have heartburn. This is caused by increased hormones that slow down muscles in the digestive tract.  You may develop or continue to have constipation because increased hormones slow digestion and cause the muscles that push waste through your intestines to relax.  You may develop hemorrhoids. These are swollen veins (varicose veins) in the rectum that can itch or be painful.  You may develop swollen, bulging veins (varicose veins) in your legs.  You may have increased body aches in the pelvis, back, or thighs. This is due to weight gain and increased hormones that are relaxing your joints.  You may have changes in your hair. These can include thickening of your hair, rapid growth, and changes in texture. Some women also have hair loss during or after pregnancy, or hair that feels dry or thin. Your hair will most likely return to normal after your baby is born.  Your breasts will continue to grow and they will continue to become tender. A yellow fluid (colostrum) may leak from your breasts. This is the first milk you are producing for your baby.  Your belly button may stick out.  You may notice more swelling in your hands,  face, or ankles.  You may have increased tingling or numbness in your hands, arms, and legs. The skin on your belly may also feel numb.  You may feel short of breath because of your expanding uterus.  You may have more problems sleeping. This can be caused by the size of your belly, increased need to urinate, and an increase in your body's metabolism.  You may notice the fetus "dropping," or moving lower in your abdomen (lightening).  You may have increased vaginal discharge.  You may notice your joints feel loose and you may have pain around your pelvic bone. What to expect at prenatal visits You will have prenatal exams every 2 weeks until week 36. Then you will have weekly prenatal exams. During a routine prenatal visit:  You will be weighed to make sure you and the baby are growing normally.  Your blood pressure will be taken.  Your abdomen will be measured to track your baby's growth.  The fetal heartbeat will be listened to.  Any test results from the previous visit will be discussed.  You may have a cervical check near your due date to see if your cervix has softened or thinned (effaced).  You will be tested for Group B streptococcus. This happens between 35 and 37 weeks. Your health care provider may ask you:  What your birth plan is.  How you are feeling.  If you are feeling the baby move.  If you have had any abnormal   symptoms, such as leaking fluid, bleeding, severe headaches, or abdominal cramping.  If you are using any tobacco products, including cigarettes, chewing tobacco, and electronic cigarettes.  If you have any questions. Other tests or screenings that may be performed during your third trimester include:  Blood tests that check for low iron levels (anemia).  Fetal testing to check the health, activity level, and growth of the fetus. Testing is done if you have certain medical conditions or if there are problems during the pregnancy.  Nonstress test  (NST). This test checks the health of your baby to make sure there are no signs of problems, such as the baby not getting enough oxygen. During this test, a belt is placed around your belly. The baby is made to move, and its heart rate is monitored during movement. What is false labor? False labor is a condition in which you feel small, irregular tightenings of the muscles in the womb (contractions) that usually go away with rest, changing position, or drinking water. These are called Braxton Hicks contractions. Contractions may last for hours, days, or even weeks before true labor sets in. If contractions come at regular intervals, become more frequent, increase in intensity, or become painful, you should see your health care provider. What are the signs of labor?  Abdominal cramps.  Regular contractions that start at 10 minutes apart and become stronger and more frequent with time.  Contractions that start on the top of the uterus and spread down to the lower abdomen and back.  Increased pelvic pressure and dull back pain.  A watery or bloody mucus discharge that comes from the vagina.  Leaking of amniotic fluid. This is also known as your "water breaking." It could be a slow trickle or a gush. Let your health care provider know if it has a color or strange odor. If you have any of these signs, call your health care provider right away, even if it is before your due date. Follow these instructions at home: Medicines  Follow your health care provider's instructions regarding medicine use. Specific medicines may be either safe or unsafe to take during pregnancy.  Take a prenatal vitamin that contains at least 600 micrograms (mcg) of folic acid.  If you develop constipation, try taking a stool softener if your health care provider approves. Eating and drinking   Eat a balanced diet that includes fresh fruits and vegetables, whole grains, good sources of protein such as meat, eggs, or tofu,  and low-fat dairy. Your health care provider will help you determine the amount of weight gain that is right for you.  Avoid raw meat and uncooked cheese. These carry germs that can cause birth defects in the baby.  If you have low calcium intake from food, talk to your health care provider about whether you should take a daily calcium supplement.  Eat four or five small meals rather than three large meals a day.  Limit foods that are high in fat and processed sugars, such as fried and sweet foods.  To prevent constipation: ? Drink enough fluid to keep your urine clear or pale yellow. ? Eat foods that are high in fiber, such as fresh fruits and vegetables, whole grains, and beans. Activity  Exercise only as directed by your health care provider. Most women can continue their usual exercise routine during pregnancy. Try to exercise for 30 minutes at least 5 days a week. Stop exercising if you experience uterine contractions.  Avoid heavy lifting.  Do   not exercise in extreme heat or humidity, or at high altitudes.  Wear low-heel, comfortable shoes.  Practice good posture.  You may continue to have sex unless your health care provider tells you otherwise. Relieving pain and discomfort  Take frequent breaks and rest with your legs elevated if you have leg cramps or low back pain.  Take warm sitz baths to soothe any pain or discomfort caused by hemorrhoids. Use hemorrhoid cream if your health care provider approves.  Wear a good support bra to prevent discomfort from breast tenderness.  If you develop varicose veins: ? Wear support pantyhose or compression stockings as told by your healthcare provider. ? Elevate your feet for 15 minutes, 3-4 times a day. Prenatal care  Write down your questions. Take them to your prenatal visits.  Keep all your prenatal visits as told by your health care provider. This is important. Safety  Wear your seat belt at all times when driving.  Make  a list of emergency phone numbers, including numbers for family, friends, the hospital, and police and fire departments. General instructions  Avoid cat litter boxes and soil used by cats. These carry germs that can cause birth defects in the baby. If you have a cat, ask someone to clean the litter box for you.  Do not travel far distances unless it is absolutely necessary and only with the approval of your health care provider.  Do not use hot tubs, steam rooms, or saunas.  Do not drink alcohol.  Do not use any products that contain nicotine or tobacco, such as cigarettes and e-cigarettes. If you need help quitting, ask your health care provider.  Do not use any medicinal herbs or unprescribed drugs. These chemicals affect the formation and growth of the baby.  Do not douche or use tampons or scented sanitary pads.  Do not cross your legs for long periods of time.  To prepare for the arrival of your baby: ? Take prenatal classes to understand, practice, and ask questions about labor and delivery. ? Make a trial run to the hospital. ? Visit the hospital and tour the maternity area. ? Arrange for maternity or paternity leave through employers. ? Arrange for family and friends to take care of pets while you are in the hospital. ? Purchase a rear-facing car seat and make sure you know how to install it in your car. ? Pack your hospital bag. ? Prepare the baby's nursery. Make sure to remove all pillows and stuffed animals from the baby's crib to prevent suffocation.  Visit your dentist if you have not gone during your pregnancy. Use a soft toothbrush to brush your teeth and be gentle when you floss. Contact a health care provider if:  You are unsure if you are in labor or if your water has broken.  You become dizzy.  You have mild pelvic cramps, pelvic pressure, or nagging pain in your abdominal area.  You have lower back pain.  You have persistent nausea, vomiting, or diarrhea.   You have an unusual or bad smelling vaginal discharge.  You have pain when you urinate. Get help right away if:  Your water breaks before 37 weeks.  You have regular contractions less than 5 minutes apart before 37 weeks.  You have a fever.  You are leaking fluid from your vagina.  You have spotting or bleeding from your vagina.  You have severe abdominal pain or cramping.  You have rapid weight loss or weight gain.  You have   shortness of breath with chest pain.  You notice sudden or extreme swelling of your face, hands, ankles, feet, or legs.  Your baby makes fewer than 10 movements in 2 hours.  You have severe headaches that do not go away when you take medicine.  You have vision changes. Summary  The third trimester is from week 28 through week 40, months 7 through 9. The third trimester is a time when the unborn baby (fetus) is growing rapidly.  During the third trimester, your discomfort may increase as you and your baby continue to gain weight. You may have abdominal, leg, and back pain, sleeping problems, and an increased need to urinate.  During the third trimester your breasts will keep growing and they will continue to become tender. A yellow fluid (colostrum) may leak from your breasts. This is the first milk you are producing for your baby.  False labor is a condition in which you feel small, irregular tightenings of the muscles in the womb (contractions) that eventually go away. These are called Braxton Hicks contractions. Contractions may last for hours, days, or even weeks before true labor sets in.  Signs of labor can include: abdominal cramps; regular contractions that start at 10 minutes apart and become stronger and more frequent with time; watery or bloody mucus discharge that comes from the vagina; increased pelvic pressure and dull back pain; and leaking of amniotic fluid. This information is not intended to replace advice given to you by your health  care provider. Make sure you discuss any questions you have with your health care provider. Document Released: 03/05/2001 Document Revised: 07/02/2018 Document Reviewed: 04/16/2016 Elsevier Patient Education  2020 [employer]  

## 2019-03-02 NOTE — Progress Notes (Signed)
Patient reports fetal movement with occassional pressure. Pt desires flu vaccine today.

## 2019-03-03 LAB — CBC
Hematocrit: 36.5 % (ref 34.0–46.6)
Hemoglobin: 12.3 g/dL (ref 11.1–15.9)
MCH: 31.7 pg (ref 26.6–33.0)
MCHC: 33.7 g/dL (ref 31.5–35.7)
MCV: 94 fL (ref 79–97)
Platelets: 226 10*3/uL (ref 150–450)
RBC: 3.88 x10E6/uL (ref 3.77–5.28)
RDW: 13.1 % (ref 11.7–15.4)
WBC: 9.6 10*3/uL (ref 3.4–10.8)

## 2019-03-03 LAB — RPR: RPR Ser Ql: NONREACTIVE

## 2019-03-03 LAB — GLUCOSE TOLERANCE, 2 HOURS W/ 1HR
Glucose, 1 hour: 202 mg/dL — ABNORMAL HIGH (ref 65–179)
Glucose, 2 hour: 177 mg/dL — ABNORMAL HIGH (ref 65–152)
Glucose, Fasting: 91 mg/dL (ref 65–91)

## 2019-03-03 LAB — HIV ANTIBODY (ROUTINE TESTING W REFLEX): HIV Screen 4th Generation wRfx: NONREACTIVE

## 2019-03-04 ENCOUNTER — Ambulatory Visit (HOSPITAL_COMMUNITY): Payer: Medicaid Other | Admitting: *Deleted

## 2019-03-04 ENCOUNTER — Ambulatory Visit (HOSPITAL_COMMUNITY)
Admission: RE | Admit: 2019-03-04 | Discharge: 2019-03-04 | Disposition: A | Discharge status: Home / Self Care | Payer: Medicaid Other | Source: Ambulatory Visit | Attending: Obstetrics and Gynecology | Admitting: Obstetrics and Gynecology

## 2019-03-04 ENCOUNTER — Other Ambulatory Visit (HOSPITAL_COMMUNITY): Payer: Self-pay | Admitting: *Deleted

## 2019-03-04 ENCOUNTER — Other Ambulatory Visit: Payer: Self-pay

## 2019-03-04 ENCOUNTER — Encounter (HOSPITAL_COMMUNITY): Payer: Self-pay

## 2019-03-04 DIAGNOSIS — Z362 Encounter for other antenatal screening follow-up: Secondary | ICD-10-CM

## 2019-03-04 DIAGNOSIS — E669 Obesity, unspecified: Secondary | ICD-10-CM | POA: Diagnosis not present

## 2019-03-04 DIAGNOSIS — O99212 Obesity complicating pregnancy, second trimester: Secondary | ICD-10-CM

## 2019-03-04 DIAGNOSIS — Z3A27 27 weeks gestation of pregnancy: Secondary | ICD-10-CM | POA: Diagnosis not present

## 2019-03-04 DIAGNOSIS — O365920 Maternal care for other known or suspected poor fetal growth, second trimester, not applicable or unspecified: Secondary | ICD-10-CM | POA: Diagnosis not present

## 2019-03-04 DIAGNOSIS — O283 Abnormal ultrasonic finding on antenatal screening of mother: Secondary | ICD-10-CM | POA: Insufficient documentation

## 2019-03-04 NOTE — Progress Notes (Unsigned)
.  mfm

## 2019-03-07 DIAGNOSIS — O24410 Gestational diabetes mellitus in pregnancy, diet controlled: Secondary | ICD-10-CM

## 2019-03-07 DIAGNOSIS — O24419 Gestational diabetes mellitus in pregnancy, unspecified control: Secondary | ICD-10-CM | POA: Insufficient documentation

## 2019-03-07 HISTORY — DX: Gestational diabetes mellitus in pregnancy, diet controlled: O24.410

## 2019-03-07 NOTE — Addendum Note (Signed)
Addended by: Fatima Blank A on: 03/07/2019 02:48 PM   Modules accepted: Orders

## 2019-03-08 ENCOUNTER — Other Ambulatory Visit: Payer: Self-pay | Admitting: *Deleted

## 2019-03-08 DIAGNOSIS — O24419 Gestational diabetes mellitus in pregnancy, unspecified control: Secondary | ICD-10-CM

## 2019-03-08 DIAGNOSIS — Z3400 Encounter for supervision of normal first pregnancy, unspecified trimester: Secondary | ICD-10-CM

## 2019-03-08 MED ORDER — ACCU-CHEK GUIDE VI STRP: ORAL_STRIP | 5 refills | Status: DC

## 2019-03-08 MED ORDER — ACCU-CHEK FASTCLIX LANCETS MISC: 1.0000 | Freq: Four times a day (QID) | 5 refills | Status: DC

## 2019-03-08 MED ORDER — ACCU-CHEK GUIDE W/DEVICE KIT: 1.0000 | PACK | Freq: Once | 0 refills | Status: AC

## 2019-03-08 NOTE — Progress Notes (Signed)
Diabetic testing supplies sent in to pharmacy today.  Pt made aware of results, need for supplies, nutrition consult has been ordered and questions answered.  Advised to call if she has any further questions/concerns.

## 2019-03-12 ENCOUNTER — Inpatient Hospital Stay (HOSPITAL_COMMUNITY)
Admission: AD | Admit: 2019-03-12 | Discharge: 2019-03-12 | Disposition: A | Discharge status: Home / Self Care | Payer: Medicaid Other | Attending: Obstetrics & Gynecology | Admitting: Obstetrics & Gynecology

## 2019-03-12 ENCOUNTER — Encounter (HOSPITAL_COMMUNITY): Payer: Self-pay | Admitting: Obstetrics & Gynecology

## 2019-03-12 ENCOUNTER — Other Ambulatory Visit: Payer: Self-pay

## 2019-03-12 DIAGNOSIS — Z87891 Personal history of nicotine dependence: Secondary | ICD-10-CM | POA: Insufficient documentation

## 2019-03-12 DIAGNOSIS — R309 Painful micturition, unspecified: Secondary | ICD-10-CM | POA: Diagnosis not present

## 2019-03-12 DIAGNOSIS — Z79899 Other long term (current) drug therapy: Secondary | ICD-10-CM | POA: Insufficient documentation

## 2019-03-12 DIAGNOSIS — E119 Type 2 diabetes mellitus without complications: Secondary | ICD-10-CM | POA: Diagnosis not present

## 2019-03-12 DIAGNOSIS — O26893 Other specified pregnancy related conditions, third trimester: Secondary | ICD-10-CM | POA: Diagnosis not present

## 2019-03-12 DIAGNOSIS — Z3A28 28 weeks gestation of pregnancy: Secondary | ICD-10-CM | POA: Insufficient documentation

## 2019-03-12 DIAGNOSIS — O99343 Other mental disorders complicating pregnancy, third trimester: Secondary | ICD-10-CM | POA: Insufficient documentation

## 2019-03-12 DIAGNOSIS — R102 Pelvic and perineal pain: Secondary | ICD-10-CM | POA: Diagnosis not present

## 2019-03-12 DIAGNOSIS — O24113 Pre-existing diabetes mellitus, type 2, in pregnancy, third trimester: Secondary | ICD-10-CM | POA: Diagnosis not present

## 2019-03-12 DIAGNOSIS — R109 Unspecified abdominal pain: Secondary | ICD-10-CM | POA: Diagnosis not present

## 2019-03-12 DIAGNOSIS — F329 Major depressive disorder, single episode, unspecified: Secondary | ICD-10-CM | POA: Diagnosis not present

## 2019-03-12 DIAGNOSIS — O26899 Other specified pregnancy related conditions, unspecified trimester: Secondary | ICD-10-CM

## 2019-03-12 HISTORY — DX: Type 2 diabetes mellitus without complications: E11.9

## 2019-03-12 LAB — URINALYSIS, ROUTINE W REFLEX MICROSCOPIC
Bilirubin Urine: NEGATIVE
Glucose, UA: NEGATIVE mg/dL
Hgb urine dipstick: NEGATIVE
Ketones, ur: 20 mg/dL — AB
Leukocytes,Ua: NEGATIVE
Nitrite: NEGATIVE
Protein, ur: NEGATIVE mg/dL
Specific Gravity, Urine: 1.016 (ref 1.005–1.030)
pH: 6 (ref 5.0–8.0)

## 2019-03-12 NOTE — MAU Note (Signed)
Patient states she felt a lot of lower abdominal pressure when she stood up.  Has mostly gone away now.  Pain 2/10.  Denies CTX.  Denies vaginal bleeding/discharge/LOF.  Reports good fetal movement.

## 2019-03-12 NOTE — Discharge Instructions (Signed)

## 2019-03-12 NOTE — MAU Note (Signed)
Discussed discharge with Dr Dione Plover.  Patient left room prior to going over AVS with RN and signing for discharge.  Pt was in good condition and stated all questions were answered by Dr Dione Plover during the visit with him.

## 2019-03-12 NOTE — MAU Provider Note (Signed)
History     [id number]  Arrival date and time: 03/12/19 1347    Chief Complaint  Patient presents with  . Abdominal Pain     HPI Tricia Solomon is a 23 y.o. at 60w3dby LMP c/w 19 wk [place] GDMA1, who presents for pelvic pressure.   Has been feeling pelvic pressure around noon Was not electric, more like a heavy pressure Lasted a few minutes and then went away Has not felt it again since then Has had it once or twice before Has had only a bottle of water and some toast with peanut butter today, has not felt very hungry today Denies fevers, vaginal discharge Endorses some burning with urination  Vaginal bleeding: No LOF: No Fetal Movement: Yes Contractions: No  O/Positive/-- (08/18 1049)  OB History    Gravida  1   Para      Term      Preterm      AB      Living  0     SAB      TAB      Ectopic      Multiple      Live Births              Past Medical History:  Diagnosis Date  . Depression   . Diabetes mellitus without complication (HCC)    GDM    Past Surgical History:  Procedure Laterality Date  . NO PAST SURGERIES      Family History  Family history unknown: Yes    Social History   Socioeconomic History  . Marital status: Single    Spouse name: Not on file  . Number of children: Not on file  . Years of education: Not on file  . Highest education level: Not on file  Occupational History  . Not on file  Tobacco Use  . Smoking status: Former SResearch scientist (life sciences) . Smokeless tobacco: Never Used  Substance and Sexual Activity  . Alcohol use: Not Currently  . Drug use: Yes    Types: Marijuana    Comment: last used prior to pregnancy  . Sexual activity: Yes  Other Topics Concern  . Not on file  Social History Narrative  . Not on file   Social Determinants of Health   Financial Resource Strain:   . Difficulty of Paying Living Expenses: Not on file  Food Insecurity:   . Worried About RCharity fundraiserin the Last Year: Not  on file  . Ran Out of Food in the Last Year: Not on file  Transportation Needs:   . Lack of Transportation (Medical): Not on file  . Lack of Transportation (Non-Medical): Not on file  Physical Activity:   . Days of Exercise per Week: Not on file  . Minutes of Exercise per Session: Not on file  Stress:   . Feeling of Stress : Not on file  Social Connections:   . Frequency of Communication with Friends and Family: Not on file  . Frequency of Social Gatherings with Friends and Family: Not on file  . Attends Religious Services: Not on file  . Active Member of Clubs or Organizations: Not on file  . Attends CArchivistMeetings: Not on file  . Marital Status: Not on file  Intimate Partner Violence:   . Fear of Current or Ex-Partner: Not on file  . Emotionally Abused: Not on file  . Physically Abused: Not on file  . Sexually Abused: Not  on file    No Known Allergies  No current facility-administered medications on file prior to encounter.   Current Outpatient Medications on File Prior to Encounter  Medication Sig Dispense Refill  . Prenatal Vit-Fe Fumarate-FA (MULTIVITAMIN-PRENATAL) 27-0.8 MG TABS tablet Take 1 tablet by mouth daily at 12 noon.    . Accu-Chek FastClix Lancets MISC 1 each by Percutaneous route 4 (four) times daily. 100 each 5  . Blood Pressure KIT MONITOR BP READINGS AT HOME REGULARLY O09.90 LARGE (Patient not taking: Reported on 11/03/2018) 1 kit 0  . glucose blood (ACCU-CHEK GUIDE) test strip Use 1 test strip to check blood glucose 4 times daily 100 each 5  . PARoxetine (PAXIL) 20 MG tablet Take 1 tablet (20 mg total) by mouth daily. (Patient not taking: Reported on 10/13/2018) 90 tablet 1  . pyridoxine (B-6) 100 MG tablet Take 100 mg by mouth daily.    . QUEtiapine (SEROQUEL) 25 MG tablet Take 1 tablet (25 mg total) by mouth at bedtime. (Patient not taking: Reported on 10/13/2018) 90 tablet 1  . sodium chloride (OCEAN) 0.65 % SOLN nasal spray Place 1 spray into  both nostrils as needed for congestion. (Patient not taking: Reported on 10/13/2018) 1 Bottle 0     ROS Complete ROS completed and otherwise negative except as noted in HPI  Physical Exam   BP 117/69   Pulse 96   Temp 98.3 F (36.8 C) (Oral)   Resp 18   Wt 100.1 kg   LMP 08/25/2018   SpO2 98%   BMI 39.10 kg/m   Physical Exam  Vitals reviewed. Constitutional: She appears well-developed and well-nourished. No distress.  Eyes: No scleral icterus.  Respiratory: Effort normal. No respiratory distress.  GI: Soft. Bowel sounds are normal. She exhibits no distension. There is no abdominal tenderness. There is no rebound and no guarding.  Musculoskeletal:        General: No edema.  Neurological: She is alert. Coordination normal.  Skin: Skin is warm and dry. She is not diaphoretic.  Psychiatric: She has a normal mood and affect.    Dilation: Fingertip Effacement (%): 30 Station: -3 Exam by:: Lamount Cohen, MD   Bedside Ultrasound Not performed  FHT Baseline 135, moderate variability, +10x10 accels, no decels, no contractions on monitor Cat I Reactive NST  MAU Course  Procedures NST  MDM Moderate  Assessment and Plan  #Pelvic pressure in 3rd trimester Reactive NST with no contractions on the monitor. Initial SVE showed fingertip dilation, kept for observation for one hour with no change in cervix and continues to deny any contractions or current discomfort. Pressure likely secondary to advancing gestational age, reviewed return precautions in detail. Given hx of dysuria will also send urine culture though doubt this is the etiology, UA not sent until time of discharge but will call to treat presumptively if highly suggestive of UTI. Low suspicion for other pelvic pathology/infection given lack of other symptoms.  D/c to home in stable condition.   Clarnce Flock

## 2019-03-13 LAB — CULTURE, OB URINE: Culture: NO GROWTH

## 2019-03-22 ENCOUNTER — Ambulatory Visit (HOSPITAL_COMMUNITY): Payer: Medicaid Other | Admitting: *Deleted

## 2019-03-22 ENCOUNTER — Ambulatory Visit (HOSPITAL_COMMUNITY)
Admission: RE | Admit: 2019-03-22 | Discharge: 2019-03-22 | Disposition: A | Discharge status: Home / Self Care | Payer: Medicaid Other | Source: Ambulatory Visit | Attending: Obstetrics and Gynecology | Admitting: Obstetrics and Gynecology

## 2019-03-22 ENCOUNTER — Other Ambulatory Visit: Payer: Self-pay

## 2019-03-22 ENCOUNTER — Encounter (HOSPITAL_COMMUNITY): Payer: Self-pay

## 2019-03-22 ENCOUNTER — Other Ambulatory Visit (HOSPITAL_COMMUNITY): Payer: Self-pay | Admitting: *Deleted

## 2019-03-22 VITALS — BP 100/76 | HR 105 | Temp 97.2°F

## 2019-03-22 DIAGNOSIS — O365930 Maternal care for other known or suspected poor fetal growth, third trimester, not applicable or unspecified: Secondary | ICD-10-CM | POA: Diagnosis not present

## 2019-03-22 DIAGNOSIS — O24410 Gestational diabetes mellitus in pregnancy, diet controlled: Secondary | ICD-10-CM | POA: Diagnosis not present

## 2019-03-22 DIAGNOSIS — Z362 Encounter for other antenatal screening follow-up: Secondary | ICD-10-CM | POA: Diagnosis not present

## 2019-03-22 DIAGNOSIS — O99213 Obesity complicating pregnancy, third trimester: Secondary | ICD-10-CM

## 2019-03-22 DIAGNOSIS — E669 Obesity, unspecified: Secondary | ICD-10-CM | POA: Diagnosis not present

## 2019-03-22 DIAGNOSIS — Z3A29 29 weeks gestation of pregnancy: Secondary | ICD-10-CM | POA: Diagnosis not present

## 2019-03-24 ENCOUNTER — Other Ambulatory Visit: Payer: Self-pay

## 2019-03-24 ENCOUNTER — Encounter: Payer: Self-pay | Admitting: Registered"

## 2019-03-24 ENCOUNTER — Encounter: Payer: Medicaid Other | Attending: Advanced Practice Midwife | Admitting: Registered"

## 2019-03-24 DIAGNOSIS — O99810 Abnormal glucose complicating pregnancy: Secondary | ICD-10-CM | POA: Diagnosis not present

## 2019-03-24 NOTE — Progress Notes (Signed)

## 2019-03-26 NOTE — L&D Delivery Note (Signed)
[facility] Delivery Note  Tricia Solomon is a 24 y.o. G1P0 s/p SVD at [redacted]w[redacted]d. She was admitted for PROM.   ROM: rupture date, rupture time, delivery date, or delivery time have not been documented with clear fluid GBS Status: negative Maximum Maternal Temperature: 99.1*F  Labor Progress: . Patient admitted for PROM that occurred on 2/18 at approximately 10 PM. She was induced with cytotec x1 and FB. She progressed without further augmentation to complete and delivered shortly after.  Delivery Date/Time: 05/15/19, 0300 hours Delivery: Called to room and patient was complete and pushing. Head delivered LOA. No nuchal cord present. Shoulder and body delivered in usual fashion. Infant with spontaneous cry, placed on mother's abdomen, dried and stimulated. Cord clamped x 2 after 1-minute delay, and cut by FOB under my direct supervision. Cord blood drawn. Placenta delivered spontaneously with gentle cord traction. Fundus firm with massage and Pitocin. Labia, perineum, vagina, and cervix were inspected, and a second degree repair was noted, repaired with Vicryl in the usual fashion.  Bleeding continued, and with palpation the patient was also found to have a deep sulcus tear. Due to the complexity of the laceration, Dr. Despina Hidden was called to evaluate and the tear was repaired with interrupted sutures of Monocryl. Arista was also applied to the area.  Placenta: 3 vessel cord, intact, to L&D Complications: ROM>24 hours Lacerations: deep sulcus tear and 2nd degree, repaired with monocryl and vicryl respectively EBL: 250 mL Analgesia: 1% lidocaine for repair  Postpartum Planning [x]  message to sent to schedule follow-up  [x]  vaccines UTD  Infant: female  APGARs 9,9  2995 g  , DO OB/GYN Fellow, Faculty [facility]

## 2019-03-30 ENCOUNTER — Telehealth (INDEPENDENT_AMBULATORY_CARE_PROVIDER_SITE_OTHER): Payer: Medicaid Other | Admitting: Advanced Practice Midwife

## 2019-03-30 VITALS — BP 112/73

## 2019-03-30 DIAGNOSIS — O24410 Gestational diabetes mellitus in pregnancy, diet controlled: Secondary | ICD-10-CM

## 2019-03-30 DIAGNOSIS — Z3A31 31 weeks gestation of pregnancy: Secondary | ICD-10-CM

## 2019-03-30 DIAGNOSIS — Z3400 Encounter for supervision of normal first pregnancy, unspecified trimester: Secondary | ICD-10-CM

## 2019-03-30 NOTE — Progress Notes (Signed)
TELEHEALTH OBSTETRICS PRENATAL VIRTUAL VIDEO VISIT ENCOUNTER NOTE  Provider location: Center for [facility] [place]   I connected with Tricia Solomon on 03/30/19 at  8:35 AM EST by MyChart Video Encounter at home and verified that I am speaking with the correct person using two identifiers.   I discussed the limitations, risks, security and privacy concerns of performing an evaluation and management service virtually and the availability of in person appointments. I also discussed with the patient that there may be a patient responsible charge related to this service. The patient expressed understanding and agreed to proceed. Subjective:  Tricia Solomon is a 24 y.o. G1P0 at [redacted]w[redacted]d being seen today for ongoing prenatal care.  She is currently monitored for the following issues for this low-risk pregnancy and has Depression with anxiety; Supervision of normal first pregnancy, antepartum; Obesity in pregnancy; and Gestational diabetes mellitus (GDM) in second trimester on their problem list.  Patient reports no complaints.  Contractions: Not present. Vag. Bleeding: None.  Movement: Present. Denies any leaking of fluid.   The following portions of the patient's history were reviewed and updated as appropriate: allergies, current medications, past family history, past medical history, past social history, past surgical history and problem list.   Objective:   Vitals:   03/30/19 0835  BP: 112/73    Fetal Status:     Movement: Present     General:  Alert, oriented and cooperative. Patient is in no acute distress.  Respiratory: Normal respiratory effort, no problems with respiration noted  Mental Status: Normal mood and affect. Normal behavior. Normal judgment and thought content.  Rest of physical exam deferred due to type of encounter  Imaging: [place] MFM OB FOLLOW UP  Result Date: 03/22/2019 ----------------------------------------------------------------------  OBSTETRICS REPORT                        (Signed Final 03/22/2019 08:54 am) ---------------------------------------------------------------------- Patient Info  ID #:       [id number]                          D.O.B.:  1995/05/12 (23 yrs)  Name:       Tricia Solomon                    Visit Date: 03/22/2019 07:54 am ---------------------------------------------------------------------- Performed By  Performed By:     Tommie Raymond BS,       Ref. Address:     [address], [place]                                                             [address] [place],                                                             [place] [address]  Attending:        Ma Rings MD  [facility]  Referred By:      Marny Lowenstein                    [place] ---------------------------------------------------------------------- Orders   #  Description                          Code         Ordered By   1  [place] MFM OB FOLLOW UP                  [phone]     RAVI [facility]  ----------------------------------------------------------------------   #  Order #                    Accession #                 Episode #   1  [id number]                  [id number]                  [id number]  ---------------------------------------------------------------------- Indications   Gestational diabetes in pregnancy, diet        O24.410   controlled   Obesity complicating pregnancy (BMI 37)        O99.210 E66.9   [redacted] weeks gestation of pregnancy                Z3A.29   Encounter for other antenatal screening        Z36.2   follow-up (low risk NIPS)   Encounter for antenatal screening for          Z36.3   malformations (Low Risk NIPS)  ---------------------------------------------------------------------- Vital Signs                                                 Height:        5'3" ---------------------------------------------------------------------- Fetal Evaluation  Num Of  Fetuses:         1  Fetal Heart Rate(bpm):  122  Cardiac Activity:       Observed  Presentation:           Cephalic  Placenta:               Posterior  P. Cord Insertion:      Previously Visualized  Amniotic Fluid  AFI FV:      Within normal limits  AFI Sum(cm)     %Tile       Largest Pocket(cm)  14.09           47          4.25  RUQ(cm)       RLQ(cm)       LUQ(cm)        LLQ(cm)  3.34          4.07  2.43           4.25 ---------------------------------------------------------------------- Biometry  BPD:      75.8  mm     G. Age:  30w 3d         56  %    CI:        75.85   %    70 - 86                                                          FL/HC:      20.2   %    19.2 - 21.4  HC:      275.9  mm     G. Age:  30w 1d         23  %    HC/AC:      1.05        0.99 - 1.21  AC:      262.9  mm     G. Age:  30w 3d         62  %    FL/BPD:     73.5   %    71 - 87  FL:       55.7  mm     G. Age:  29w 2d         21  %    FL/AC:      21.2   %    20 - 24  HUM:      47.9  mm     G. Age:  28w 0d         17  %  CER:      37.1  mm     G. Age:  32w 0d         77  %  LV:        7.6  mm  Est. FW:    1501  gm      3 lb 5 oz     44  % ---------------------------------------------------------------------- OB History  Gravidity:    1         Term:   0  Living:       0 ---------------------------------------------------------------------- Gestational Age  LMP:           29w 6d        Date:  08/25/18                 EDD:   06/01/19  U/S Today:     30w 1d                                        EDD:   05/30/19  Best:          29w 6d     Det. By:  LMP  (08/25/18)          EDD:   06/01/19 ---------------------------------------------------------------------- Anatomy  Cranium:               Appears normal         Aortic Arch:            Previously seen  Cavum:  Previously seen        Ductal Arch:            Previously seen  Ventricles:            Appears normal         Diaphragm:              Appears normal  Choroid  Plexus:        Resolved CPC prev.     Stomach:                Appears normal, left                                                                        sided  Cerebellum:            Previously seen        Abdomen:                Previously seen  Posterior Fossa:       Previously seen        Abdominal Wall:         Previously seen  Nuchal Fold:           Previously seen        Cord Vessels:           Appears normal (3                                                                        vessel cord)  Face:                  Orbits and profile     Kidneys:                Appear normal                         previously seen  Lips:                  Previously seen        Bladder:                Appears normal  Thoracic:              Previously seen        Spine:                  Previously seen  Heart:                 Echogenic focus        Upper Extremities:      Previously seen                         in LV prev.  RVOT:                  Previously seen  Lower Extremities:      Previously seen  LVOT:                  Previously seen  Other:  Female gender previously seen.  Nasal bone visualized previously.          Technically difficult due to maternal habitus and fetal position. ---------------------------------------------------------------------- Cervix Uterus Adnexa  Cervix  Not visualized (advanced GA >24wks)  Uterus  No abnormality visualized.  Left Ovary  Within normal limits. No adnexal mass visualized.  Right Ovary  Within normal limits. No adnexal mass visualized.  Cul De Sac  No free fluid seen.  Adnexa  No abnormality visualized. ---------------------------------------------------------------------- Comments  This patient was seen for a follow up growth scan due to A1  gestational diabetes and maternal obesity.  She reports that  her fingerstick values have mostly been within normal limits.  She was informed that the fetal growth and amniotic fluid  level appears appropriate for her gestational age.   The implications and management of diabetes in pregnancy  was discussed in detail with the patient. She was advised that  our goals for her fingerstick values are fasting values of 90-95  or less and two-hour postprandials of 120 or less.  Should her  fingerstick values be above these values, she may have to be  started on insulin or metformin to help her achieve better  glycemic control. The patient was advised that getting her  fingerstick values as close to these goals as possible would  provide her with the most optimal obstetrical outcome.  A follow up exam was scheduled in 4 weeks. ----------------------------------------------------------------------                   Ma RingsVictor Fang, MD Electronically Signed Final Report   03/22/2019 08:54 am ----------------------------------------------------------------------  US MFM OB FOLLOW UP  Result Date: 03/04/2019 ----------------------------------------------------------------------  OBSTETRICS REPORT                       (Signed Final 03/04/2019 10:46 am) ---------------------------------------------------------------------- Patient Info  ID #:       [id number]                          D.O.B.:  Jul 04, 1995 (23 yrs)  Name:       Tricia MassonHANNA Gildner                    Visit Date: 03/04/2019 09:39 am ---------------------------------------------------------------------- Performed By  Performed By:     Tommie RaymondMesha Tester BS,       Ref. Address:     [address]                    [place], [place]                                                             [address] [address]  Attending:        Noralee Space MD        [facility]  Referred By:      Marny Lowenstein                    PA ---------------------------------------------------------------------- Orders   #  Description                          Code         Ordered By    1  [place] MFM OB FOLLOW UP                  [id number]     [facility]  ----------------------------------------------------------------------   #  Order #                    Accession #                 Episode #   1  [id number]                  [id number]                  [id number]  ---------------------------------------------------------------------- Indications   [redacted] weeks gestation of pregnancy                Z3A.27   Obesity complicating pregnancy (BMI 37)        O99.210 E66.9   Encounter for other antenatal screening        Z36.2   follow-up (low risk NIPS)   Maternal care for known or suspected poor      O36.5920   fetal growth, second trimester, not applicable   or unspecified IUGR   Encounter for antenatal screening for          Z36.3   malformations (Low Risk NIPS)  ---------------------------------------------------------------------- Vital Signs  Weight (lb): 213                               Height:        5'3"  BMI:         37.73 ---------------------------------------------------------------------- Fetal Evaluation  Num Of Fetuses:         1  Fetal Heart Rate(bpm):  135  Cardiac Activity:       Observed  Presentation:           Breech  Placenta:               Posterior  P. Cord Insertion:      Previously Visualized  Amniotic Fluid  AFI FV:      Within normal limits                              Largest Pocket(cm)  5.26 ---------------------------------------------------------------------- Biometry  BPD:        68  mm     G. Age:  27w 3d         41  %    CI:        76.73   %    70 - 86                                                          FL/HC:      19.2   %    18.6 - 20.4  HC:      245.9  mm     G. Age:  26w 5d          9  %    HC/AC:      1.09        1.05 - 1.21  AC:      225.5  mm     G. Age:  27w 0d         31  %    FL/BPD:     69.3   %    71 - 87  FL:       47.1  mm     G. Age:  25w 5d          5  %    FL/AC:      20.9   %    20 - 24  HUM:      43.9  mm     G. Age:  26w  0d         20  %  CER:        32  mm     G. Age:  28w 0d         60  %  LV:        6.8  mm  Est. FW:     947  gm      2 lb 1 oz     14  % ---------------------------------------------------------------------- OB History  Gravidity:    1         Term:   0  Living:       0 ---------------------------------------------------------------------- Gestational Age  LMP:           27w 2d        Date:  08/25/18                 EDD:   06/01/19  U/S Today:     26w 5d                                        EDD:   06/05/19  Best:          27w 2d     Det. By:  LMP  (08/25/18)          EDD:   06/01/19 ---------------------------------------------------------------------- Anatomy  Cranium:               Appears normal         Aortic Arch:            Previously seen  Cavum:  Appears normal         Ductal Arch:            Previously seen  Ventricles:            Appears normal         Diaphragm:              Appears normal  Choroid Plexus:        Resolved CPC prev.     Stomach:                Appears normal, left                                                                        sided  Cerebellum:            Appears normal         Abdomen:                Previously seen  Posterior Fossa:       Previously seen        Abdominal Wall:         Previously seen  Nuchal Fold:           Previously seen        Cord Vessels:           Previously seen  Face:                  Orbits and profile     Kidneys:                Appear normal                         previously seen  Lips:                  Previously seen        Bladder:                Appears normal  Thoracic:              Appears normal         Spine:                  Previously seen  Heart:                 Echogenic focus        Upper Extremities:      Previously seen                         in LV prev.  RVOT:                  Previously seen        Lower Extremities:      Previously seen  LVOT:                  Previously seen  Other:  Female gender previously seen.   Nasal bone visualized previously.          Technically difficult due to maternal habitus and fetal position. ---------------------------------------------------------------------- Cervix Uterus Adnexa  Cervix  Not  visualized (advanced GA >24wks)  Uterus  No abnormality visualized.  Left Ovary  No adnexal mass visualized.  Right Ovary  No adnexal mass visualized.  Cul De Sac  No free fluid seen.  Adnexa  No abnormality visualized. ---------------------------------------------------------------------- Impression  Patient returns for fetal growth assessment.  Fetal growth is  appropriate for gestational age.  However suboptimal interval  growth is seen.  The estimated fetal weight is at the 14th  percentile.  Amniotic fluid is normal good fetal activity seen.  Patient has a recent diagnosis of gestational diabetes.  I  briefly discussed the importance of diet, exercise and medical  treatment for gestational diabetes. ---------------------------------------------------------------------- Recommendations  -An appointment was made for her to return in 3 weeks for  fetal growth assessment. ----------------------------------------------------------------------                  Noralee Space, MD Electronically Signed Final Report   03/04/2019 10:46 am ----------------------------------------------------------------------   Assessment and Plan:  Pregnancy: G1P0 at [redacted]w[redacted]d  1. Supervision of normal first pregnancy, antepartum --Pt reports good fetal movement, denies cramping, LOF, or vaginal bleeding --Anticipatory guidance about next visits/weeks of pregnancy given. --Continue growth [place] as scheduled with MFM --Next visit in 2 weeks, virtual  2. Diet controlled gestational diabetes mellitus (GDM) in third trimester --Reviewed glucose log.  Fastings range from 83-108 with 6 out of 18 above 95.  PP range from 82-139 with 11 out of 54 above 120.  Pt also reports that she was told to take her sugar 1 hour instead of 2  hours so some of these are at 1 hour.   --Discussed dietary changes with pt, carb counting, increased protein, increased PO fluids, walking/light exercise daily.  --Take glucose values at 2 hours PP --Since some glucose values were taken too early, will reevaluate in 2 weeks whether pt needs Metformin or other medication for better glucose control. Pt to notify office if increasing values occur before 2 week follow up.  Preterm labor symptoms and general obstetric precautions including but not limited to vaginal bleeding, contractions, leaking of fluid and fetal movement were reviewed in detail with the patient. I discussed the assessment and treatment plan with the patient. The patient was provided an opportunity to ask questions and all were answered. The patient agreed with the plan and demonstrated an understanding of the instructions. The patient was advised to call back or seek an in-person office evaluation/go to [facility] at [facility] for any urgent or concerning symptoms. Please refer to After Visit Summary for other counseling recommendations.   I provided 15 minutes of face-to-face time during this encounter.  No follow-ups on file.  Future Appointments  Date Time Provider Department Center  04/20/2019  8:30 AM WH-MFC NURSE WH-MFC M[facility]-US  04/20/2019  8:30 AM WH-MFC [place] 1 WH-MFCUS MFC-US    Sharen Counter, CNM [facility] [employer], [facility]

## 2019-04-13 ENCOUNTER — Encounter: Payer: Self-pay | Admitting: Obstetrics

## 2019-04-13 ENCOUNTER — Telehealth (INDEPENDENT_AMBULATORY_CARE_PROVIDER_SITE_OTHER): Payer: Medicaid Other | Admitting: Obstetrics

## 2019-04-13 VITALS — BP 121/89

## 2019-04-13 DIAGNOSIS — O0993 Supervision of high risk pregnancy, unspecified, third trimester: Secondary | ICD-10-CM

## 2019-04-13 DIAGNOSIS — O24410 Gestational diabetes mellitus in pregnancy, diet controlled: Secondary | ICD-10-CM

## 2019-04-13 DIAGNOSIS — O99213 Obesity complicating pregnancy, third trimester: Secondary | ICD-10-CM

## 2019-04-13 DIAGNOSIS — O99210 Obesity complicating pregnancy, unspecified trimester: Secondary | ICD-10-CM

## 2019-04-13 DIAGNOSIS — Z3A33 33 weeks gestation of pregnancy: Secondary | ICD-10-CM

## 2019-04-13 DIAGNOSIS — O0990 Supervision of high risk pregnancy, unspecified, unspecified trimester: Secondary | ICD-10-CM

## 2019-04-13 NOTE — Progress Notes (Signed)
Pt is on the phone preparing for virtual visit with provider. [redacted]w[redacted]d.

## 2019-04-13 NOTE — Progress Notes (Signed)
TELEHEALTH OBSTETRICS PRENATAL VIRTUAL VIDEO VISIT ENCOUNTER NOTE  Provider location: [facility] for [facility] [place]   I connected with [name] on 04/13/19 at  9:45 AM EST by OB MyChart Video Encounter at home and verified that I am speaking with the correct person using two identifiers.   I discussed the limitations, risks, security and privacy concerns of performing an evaluation and management service virtually and the availability of in person appointments. I also discussed with the patient that there may be a patient responsible charge related to this service. The patient expressed understanding and agreed to proceed. Subjective:  Tricia Solomon is a 24 y.o. G1P0 at [redacted]w[redacted]d being seen today for ongoing prenatal care.  She is currently monitored for the following issues for this high-risk pregnancy and has Depression with anxiety; Supervision of normal first pregnancy, antepartum; Obesity in pregnancy; and Diet controlled gestational diabetes mellitus (GDM) in third trimester on their problem list.  Patient reports backache.  Contractions: Not present. Vag. Bleeding: None.  Movement: Present. Denies any leaking of fluid.   The following portions of the patient's history were reviewed and updated as appropriate: allergies, current medications, past family history, past medical history, past social history, past surgical history and problem list.   Objective:   Vitals:   04/13/19 0928  BP: 121/89    Fetal Status:     Movement: Present     General:  Alert, oriented and cooperative. Patient is in no acute distress.  Respiratory: Normal respiratory effort, no problems with respiration noted  Mental Status: Normal mood and affect. Normal behavior. Normal judgment and thought content.  Rest of physical exam deferred due to type of encounter  Imaging: [place] MFM OB FOLLOW UP  Result Date: 03/22/2019 ----------------------------------------------------------------------   OBSTETRICS REPORT                       (Signed Final 03/22/2019 08:54 am) ---------------------------------------------------------------------- Patient Info  ID #:       [id number]                          D.O.B.:  November 06, 1995 (24 yrs)  Name:       [name]                    Visit Date: 03/22/2019 07:54 am ---------------------------------------------------------------------- Performed By  Performed By:     Tommie Raymond BS,       Ref. Address:     [address], [address]                                                             [address] [place],                                                             [place] [address]  Attending:        Ma Rings MD  [facility]  Referred By:      Marny LowensteinJULIE N WENZEL                    PA ---------------------------------------------------------------------- Orders   #  Description                          Code         Ordered By   1  US MFM OB FOLLOW UP                  [phone] [id number]     [facility]  ----------------------------------------------------------------------   #  Order #                    Accession #                 Episode #   1  [id number]                  [id number]                  [id number]  ---------------------------------------------------------------------- Indications   Gestational diabetes in pregnancy, diet        O24.410   controlled   Obesity complicating pregnancy (BMI 37)        O99.210 E66.9   [redacted] weeks gestation of pregnancy                Z3A.29   Encounter for other antenatal screening        Z36.2   follow-up (low risk NIPS)   Encounter for antenatal screening for          Z36.3   malformations (Low Risk NIPS)  ---------------------------------------------------------------------- Vital Signs                                                 Height:        5'3" ---------------------------------------------------------------------- Fetal  Evaluation  Num Of Fetuses:         1  Fetal Heart Rate(bpm):  122  Cardiac Activity:       Observed  Presentation:           Cephalic  Placenta:               Posterior  P. Cord Insertion:      Previously Visualized  Amniotic Fluid  AFI FV:      Within normal limits  AFI Sum(cm)     %Tile       Largest Pocket(cm)  14.09           47          4.25  RUQ(cm)       RLQ(cm)       LUQ(cm)        LLQ(cm)  3.34          4.07  2.43           4.25 ---------------------------------------------------------------------- Biometry  BPD:      75.8  mm     G. Age:  30w 3d         56  %    CI:        75.85   %    70 - 86                                                          FL/HC:      20.2   %    19.2 - 21.4  HC:      275.9  mm     G. Age:  30w 1d         23  %    HC/AC:      1.05        0.99 - 1.21  AC:      262.9  mm     G. Age:  30w 3d         62  %    FL/BPD:     73.5   %    71 - 87  FL:       55.7  mm     G. Age:  29w 2d         21  %    FL/AC:      21.2   %    20 - 24  HUM:      47.9  mm     G. Age:  28w 0d         17  %  CER:      37.1  mm     G. Age:  32w 0d         77  %  LV:        7.6  mm  Est. FW:    1501  gm      3 lb 5 oz     44  % ---------------------------------------------------------------------- OB History  Gravidity:    1         Term:   0  Living:       0 ---------------------------------------------------------------------- Gestational Age  LMP:           29w 6d        Date:  08/25/18                 EDD:   06/01/19  U/S Today:     30w 1d                                        EDD:   05/30/19  Best:          29w 6d     Det. By:  LMP  (08/25/18)          EDD:   06/01/19 ---------------------------------------------------------------------- Anatomy  Cranium:               Appears normal         Aortic Arch:            Previously seen  Cavum:  Previously seen        Ductal Arch:            Previously seen  Ventricles:            Appears normal         Diaphragm:              Appears  normal  Choroid Plexus:        Resolved CPC prev.     Stomach:                Appears normal, left                                                                        sided  Cerebellum:            Previously seen        Abdomen:                Previously seen  Posterior Fossa:       Previously seen        Abdominal Wall:         Previously seen  Nuchal Fold:           Previously seen        Cord Vessels:           Appears normal (3                                                                        vessel cord)  Face:                  Orbits and profile     Kidneys:                Appear normal                         previously seen  Lips:                  Previously seen        Bladder:                Appears normal  Thoracic:              Previously seen        Spine:                  Previously seen  Heart:                 Echogenic focus        Upper Extremities:      Previously seen                         in LV prev.  RVOT:                  Previously seen  Lower Extremities:      Previously seen  LVOT:                  Previously seen  Other:  Female gender previously seen.  Nasal bone visualized previously.          Technically difficult due to maternal habitus and fetal position. ---------------------------------------------------------------------- Cervix Uterus Adnexa  Cervix  Not visualized (advanced GA >24wks)  Uterus  No abnormality visualized.  Left Ovary  Within normal limits. No adnexal mass visualized.  Right Ovary  Within normal limits. No adnexal mass visualized.  Cul De Sac  No free fluid seen.  Adnexa  No abnormality visualized. ---------------------------------------------------------------------- Comments  This patient was seen for a follow up growth scan due to A1  gestational diabetes and maternal obesity.  She reports that  her fingerstick values have mostly been within normal limits.  She was informed that the fetal growth and amniotic fluid  level appears appropriate for her  gestational age.  The implications and management of diabetes in pregnancy  was discussed in detail with the patient. She was advised that  our goals for her fingerstick values are fasting values of 90-95  or less and two-hour postprandials of 120 or less.  Should her  fingerstick values be above these values, she may have to be  started on insulin or metformin to help her achieve better  glycemic control. The patient was advised that getting her  fingerstick values as close to these goals as possible would  provide her with the most optimal obstetrical outcome.  A follow up exam was scheduled in 4 weeks. ----------------------------------------------------------------------                   Johnell Comings, MD Electronically Signed Final Report   03/22/2019 08:54 am ----------------------------------------------------------------------   Assessment and Plan:  Pregnancy: G1P0 at [redacted]w[redacted]d 1. Supervision of high risk pregnancy, antepartum  2. Diet controlled gestational diabetes mellitus (GDM) in third trimester - FBS = ~ 90-92 mg/dl     2 hour PP's = 112-115 mg/dl  3. Obesity in pregnancy   Preterm labor symptoms and general obstetric precautions including but not limited to vaginal bleeding, contractions, leaking of fluid and fetal movement were reviewed in detail with the patient. I discussed the assessment and treatment plan with the patient. The patient was provided an opportunity to ask questions and all were answered. The patient agreed with the plan and demonstrated an understanding of the instructions. The patient was advised to call back or seek an in-person office evaluation/go to MAU at [facility] for any urgent or concerning symptoms. Please refer to After Visit Summary for other counseling recommendations.   I provided 10 minutes of face-to-face time during this encounter.  Return in about 2 weeks (around 04/27/2019) for MyChart HOB-Faculty Only.  Future Appointments  Date  Time Provider [place]  04/13/2019  9:45 AM Shelly Bombard, MD [place] None  04/20/2019  8:30 AM [place] NURSE [place] MFC-US  04/20/2019  8:30 AM [place] 1 WH-MFCUS MFC-US    Baltazar Najjar, [place] for [facility] [place] [facility] 04/13/2019

## 2019-04-20 ENCOUNTER — Encounter (HOSPITAL_COMMUNITY): Payer: Self-pay

## 2019-04-20 ENCOUNTER — Ambulatory Visit (HOSPITAL_COMMUNITY): Payer: Medicaid Other | Admitting: *Deleted

## 2019-04-20 ENCOUNTER — Ambulatory Visit (HOSPITAL_COMMUNITY)
Admission: RE | Admit: 2019-04-20 | Discharge: 2019-04-20 | Disposition: A | Discharge status: Home / Self Care | Payer: Medicaid Other | Source: Ambulatory Visit | Attending: Obstetrics and Gynecology | Admitting: Obstetrics and Gynecology

## 2019-04-20 ENCOUNTER — Other Ambulatory Visit (HOSPITAL_COMMUNITY): Payer: Self-pay | Admitting: *Deleted

## 2019-04-20 ENCOUNTER — Other Ambulatory Visit: Payer: Self-pay

## 2019-04-20 VITALS — BP 115/72 | HR 104 | Temp 96.9°F

## 2019-04-20 DIAGNOSIS — O24410 Gestational diabetes mellitus in pregnancy, diet controlled: Secondary | ICD-10-CM

## 2019-04-20 DIAGNOSIS — E669 Obesity, unspecified: Secondary | ICD-10-CM | POA: Diagnosis not present

## 2019-04-20 DIAGNOSIS — Z3A34 34 weeks gestation of pregnancy: Secondary | ICD-10-CM

## 2019-04-20 DIAGNOSIS — O99213 Obesity complicating pregnancy, third trimester: Secondary | ICD-10-CM

## 2019-04-20 DIAGNOSIS — Z362 Encounter for other antenatal screening follow-up: Secondary | ICD-10-CM

## 2019-04-21 DIAGNOSIS — R000 Tachycardia, unspecified: Secondary | ICD-10-CM | POA: Diagnosis not present

## 2019-04-21 DIAGNOSIS — R55 Syncope and collapse: Secondary | ICD-10-CM | POA: Diagnosis not present

## 2019-04-27 ENCOUNTER — Encounter: Payer: Self-pay | Admitting: Obstetrics and Gynecology

## 2019-04-27 ENCOUNTER — Telehealth (INDEPENDENT_AMBULATORY_CARE_PROVIDER_SITE_OTHER): Payer: Medicaid Other | Admitting: Obstetrics and Gynecology

## 2019-04-27 VITALS — BP 111/82 | HR 97

## 2019-04-27 DIAGNOSIS — O24410 Gestational diabetes mellitus in pregnancy, diet controlled: Secondary | ICD-10-CM

## 2019-04-27 DIAGNOSIS — Z3A35 35 weeks gestation of pregnancy: Secondary | ICD-10-CM

## 2019-04-27 DIAGNOSIS — Z3400 Encounter for supervision of normal first pregnancy, unspecified trimester: Secondary | ICD-10-CM

## 2019-04-27 NOTE — Progress Notes (Signed)
TELEHEALTH OBSTETRICS PRENATAL VIRTUAL VIDEO VISIT ENCOUNTER NOTE  Provider location: [facility] [place]   I connected with Tricia Solomon on 04/27/19 at 10:30 AM EST by MyChart Video Encounter at home and verified that I am speaking with the correct person using two identifiers.   I discussed the limitations, risks, security and privacy concerns of performing an evaluation and management service virtually and the availability of in person appointments. I also discussed with the patient that there may be a patient responsible charge related to this service. The patient expressed understanding and agreed to proceed. Subjective:  Tricia Solomon is a 24 y.o. G1P0 at [redacted]w[redacted]d being seen today for ongoing prenatal care.  She is currently monitored for the following issues for this high-risk pregnancy and has Depression with anxiety; Supervision of normal first pregnancy, antepartum; Obesity in pregnancy; and Diet controlled gestational diabetes mellitus (GDM) in third trimester on their problem list.  Patient reports no complaints.  Contractions: Not present. Vag. Bleeding: None.  Movement: Present. Denies any leaking of fluid.   The following portions of the patient's history were reviewed and updated as appropriate: allergies, current medications, past family history, past medical history, past social history, past surgical history and problem list.   Objective:   Vitals:   04/27/19 0953  BP: 111/82  Pulse: 97    Fetal Status:     Movement: Present     General:  Alert, oriented and cooperative. Patient is in no acute distress.  Respiratory: Normal respiratory effort, no problems with respiration noted  Mental Status: Normal mood and affect. Normal behavior. Normal judgment and thought content.  Rest of physical exam deferred due to type of encounter  Imaging: [place] MFM OB FOLLOW UP  Result Date:  04/20/2019 ----------------------------------------------------------------------  OBSTETRICS REPORT                       (Signed Final 04/20/2019 10:37 am) ---------------------------------------------------------------------- Patient Info  ID #:       [id number]                          D.O.B.:  16-Feb-1996 (23 yrs)  Name:       Tricia Solomon                    Visit Date: 04/20/2019 08:35 am ---------------------------------------------------------------------- Performed By  Performed By:     Birdena Crandall        Ref. Address:     [address] [place],                                                             [place] [address]  Attending:        Ma Rings MD  [facility]  Referred By:      Marny Lowenstein                    PA ---------------------------------------------------------------------- Orders   #  Description                          Code         Ordered By   1  [place] MFM OB FOLLOW UP                  [phone]     YU FANG  ----------------------------------------------------------------------   #  Order #                    Accession #                 Episode #   1  [id number]                  [id number]                  [id number]  ---------------------------------------------------------------------- Indications   Gestational diabetes in pregnancy, diet        O24.410   controlled   Obesity complicating pregnancy (BMI 37)        O99.210 E66.9   Encounter for antenatal screening for          Z36.3   malformations (Low Risk NIPS)   [redacted] weeks gestation of pregnancy                Z3A.34  ---------------------------------------------------------------------- Vital Signs  Weight (lb): 220                               Height:        5'3"  BMI:         38.97  ---------------------------------------------------------------------- Fetal Evaluation  Num Of Fetuses:         1  Fetal Heart Rate(bpm):  129  Cardiac Activity:       Observed  Presentation:           Cephalic  Placenta:               LT Fundal  P. Cord Insertion:      Previously Visualized  Amniotic Fluid  AFI FV:      Within normal limits  AFI Sum(cm)     %Tile       Largest Pocket(cm)  11.27           28          3.49  RUQ(cm)       RLQ(cm)       LUQ(cm)        LLQ(cm)  1.92          3.49          3.33           2.53 ---------------------------------------------------------------------- Biometry  BPD:  87.3  mm     G. Age:  35w 2d         81  %    CI:         76.3   %    70 - 86                                                          FL/HC:      19.9   %    19.4 - 21.8  HC:      316.7  mm     G. Age:  35w 4d         55  %    HC/AC:      1.05        0.96 - 1.11  AC:      302.6  mm     G. Age:  34w 2d         61  %    FL/BPD:     72.1   %    71 - 87  FL:       62.9  mm     G. Age:  32w 4d         10  %    FL/AC:      20.8   %    20 - 24  Est. FW:    2324  gm      5 lb 2 oz     43  % ---------------------------------------------------------------------- OB History  Gravidity:    1         Term:   0  Living:       0 ---------------------------------------------------------------------- Gestational Age  LMP:           34w 0d        Date:  08/25/18                 EDD:   06/01/19  U/S Today:     34w 3d                                        EDD:   05/29/19  Best:          34w 0d     Det. By:  LMP  (08/25/18)          EDD:   06/01/19 ---------------------------------------------------------------------- Anatomy  Cranium:               Appears normal         Aortic Arch:            Previously seen  Cavum:                 Appears normal         Ductal Arch:            Previously seen  Ventricles:            Appears normal         Diaphragm:              Appears normal  Choroid Plexus:        Resolved CPC prev.      Stomach:  Appears normal, left                                                                        sided  Cerebellum:            Appears normal         Abdomen:                Appears normal  Posterior Fossa:       Appears normal         Abdominal Wall:         Previously seen  Nuchal Fold:           Previously seen        Cord Vessels:           Appears normal (3                                                                        vessel cord)  Face:                  Orbits and profile     Kidneys:                Appear normal                         previously seen  Lips:                  Previously seen        Bladder:                Appears normal  Thoracic:              Previously seen        Spine:                  Previously seen  Heart:                 Echogenic focus        Upper Extremities:      Previously seen                         in LV prev.  RVOT:                  Appears normal         Lower Extremities:      Previously seen  LVOT:                  Appears normal  Other:  Female gender previously seen.  Nasal bone visualized previously.          Technically difficult due to maternal habitus and fetal position. ---------------------------------------------------------------------- Cervix Uterus Adnexa  Cervix  Not visualized (advanced GA >24wks) ---------------------------------------------------------------------- Comments  This patient was seen for a follow up growth scan due to A1  gestational diabetes.  She reports that her fingersticks have  mostly been within normal limits.  She was informed that the fetal growth and amniotic fluid  level appears appropriate for her gestational age.  A follow up exam was scheduled in 4 weeks to assess the  fetal weight closer to delivery. ----------------------------------------------------------------------                   Ma Rings, MD Electronically Signed Final Report   04/20/2019 10:37 am  ----------------------------------------------------------------------   Assessment and Plan:  Pregnancy: G1P0 at [redacted]w[redacted]d 1. Supervision of normal first pregnancy, antepartum Stable GBS and vaginal cultures with next visit  2. Diet controlled gestational diabetes mellitus (GDM) in third trimester CBG's in goal range Growth 43 % 04/20/19, F/U scheduled 05/18/19 Continue with diet  Preterm labor symptoms and general obstetric precautions including but not limited to vaginal bleeding, contractions, leaking of fluid and fetal movement were reviewed in detail with the patient. I discussed the assessment and treatment plan with the patient. The patient was provided an opportunity to ask questions and all were answered. The patient agreed with the plan and demonstrated an understanding of the instructions. The patient was advised to call back or seek an in-person office evaluation/go to [facility] at [facility] for any urgent or concerning symptoms. Please refer to After Visit Summary for other counseling recommendations.   I provided 8 minutes of face-to-face time during this encounter.  Return in about 1 week (around 05/04/2019) for OB visit, face to face, MD provider, GBS.  Future Appointments  Date Time Provider Department Center  04/27/2019 10:30 AM Hermina Staggers, MD [facility] None  05/18/2019  8:45 AM [facility] NURSE [facility] MFC-US  05/18/2019  8:45 AM [facility] [place] 2 [facility]-M[facility]US MFC-US    Hermina Staggers, MD Center for [facility], [facility]

## 2019-04-27 NOTE — Progress Notes (Signed)
Pt state she had an episode on Wednesday where she felt faint, had visual / hearing changes- called EMS- they stated pt was stable.   Pt states she was not seen at hospital.  Pt states she has not felt like this since.

## 2019-05-04 ENCOUNTER — Other Ambulatory Visit (HOSPITAL_COMMUNITY)
Admission: RE | Admit: 2019-05-04 | Discharge: 2019-05-04 | Disposition: A | Discharge status: Home / Self Care | Payer: Medicaid Other | Source: Ambulatory Visit | Attending: Obstetrics & Gynecology | Admitting: Obstetrics & Gynecology

## 2019-05-04 ENCOUNTER — Other Ambulatory Visit: Payer: Self-pay

## 2019-05-04 ENCOUNTER — Ambulatory Visit (INDEPENDENT_AMBULATORY_CARE_PROVIDER_SITE_OTHER): Payer: Medicaid Other | Admitting: Obstetrics & Gynecology

## 2019-05-04 VITALS — BP 109/77 | HR 99 | Wt 225.2 lb

## 2019-05-04 DIAGNOSIS — O24410 Gestational diabetes mellitus in pregnancy, diet controlled: Secondary | ICD-10-CM

## 2019-05-04 DIAGNOSIS — Z3400 Encounter for supervision of normal first pregnancy, unspecified trimester: Secondary | ICD-10-CM | POA: Diagnosis not present

## 2019-05-04 DIAGNOSIS — O99213 Obesity complicating pregnancy, third trimester: Secondary | ICD-10-CM

## 2019-05-04 DIAGNOSIS — Z3A36 36 weeks gestation of pregnancy: Secondary | ICD-10-CM

## 2019-05-04 DIAGNOSIS — O99210 Obesity complicating pregnancy, unspecified trimester: Secondary | ICD-10-CM

## 2019-05-04 NOTE — Progress Notes (Signed)
   PRENATAL VISIT NOTE  Subjective:  Tricia Solomon is a 24 y.o. G1P0 at [redacted]w[redacted]d being seen today for ongoing prenatal care.  She is currently monitored for the following issues for this high-risk pregnancy and has Depression with anxiety; Supervision of normal first pregnancy, antepartum; Obesity in pregnancy; and Diet controlled gestational diabetes mellitus (GDM) in third trimester on their problem list.  Patient reports no complaints.  Contractions: Not present. Vag. Bleeding: None.  Movement: Present. Denies leaking of fluid.   The following portions of the patient's history were reviewed and updated as appropriate: allergies, current medications, past family history, past medical history, past social history, past surgical history and problem list.   Objective:   Vitals:   05/04/19 1003  BP: 109/77  Pulse: 99  Weight: 225 lb 3.2 oz (102.2 kg)    Fetal Status: Fetal Heart Rate (bpm): 140   Movement: Present  Presentation: Vertex  General:  Alert, oriented and cooperative. Patient is in no acute distress.  Skin: Skin is warm and dry. No rash noted.   Cardiovascular: Normal heart rate noted  Respiratory: Normal respiratory effort, no problems with respiration noted  Abdomen: Soft, gravid, appropriate for gestational age.  Pain/Pressure: Present     Pelvic: Cervical exam performed Dilation: 1.5 Effacement (%): 50 Station: -3  Extremities: Normal range of motion.  Edema: Trace  Mental Status: Normal mood and affect. Normal behavior. Normal judgment and thought content.   Assessment and Plan:  Pregnancy: G1P0 at [redacted]w[redacted]d 1. Diet controlled gestational diabetes mellitus (GDM) in third trimester FBS <95 and PP<=121, continue diet control  2. Obesity in pregnancy Body mass index is 39.89 kg/m.   3. Supervision of normal first pregnancy, antepartum High risk,US shows normal growth  Preterm labor symptoms and general obstetric precautions including but not limited to vaginal bleeding,  contractions, leaking of fluid and fetal movement were reviewed in detail with the patient. Please refer to After Visit Summary for other counseling recommendations.   Return in about 1 week (around 05/11/2019) for virtual ok.  Future Appointments  Date Time Provider Department Center  05/12/2019  2:30 PM Adam Phenix, MD [facility] None  05/18/2019  8:45 AM [facility] NURSE [facility]-MFC MFC-US  05/18/2019  8:45 AM [facility] [place] 2 WH-MFCUS MFC-US    Scheryl Darter, MD

## 2019-05-04 NOTE — Progress Notes (Signed)
Patient reports fetal movement with some pressure today.

## 2019-05-04 NOTE — Patient Instructions (Signed)

## 2019-05-05 LAB — CERVICOVAGINAL ANCILLARY ONLY
Chlamydia: NEGATIVE
Comment: NEGATIVE
Comment: NORMAL
Neisseria Gonorrhea: NEGATIVE

## 2019-05-06 ENCOUNTER — Telehealth: Payer: Self-pay | Admitting: *Deleted

## 2019-05-06 LAB — STREP GP B NAA: Strep Gp B NAA: NEGATIVE

## 2019-05-06 NOTE — Telephone Encounter (Signed)
Spoke with pt about d/c she has been having since her visit this week. Pt states she had cervix check on Tuesday and has had small amount spotting and thick discharge since. Pt made aware that small amount of spotting after an exam can be normal.  Pt states it has decreased since visit.  Pt also made aware she may be losing her mucous plug by discharge she describes.  Pt made aware this is normal and may continue the next few days.  Pt advised she may continue to have some discharge until delivery- if she has any cervical change.  Pt denies ctx, LOF- states +FM. Pt advised that if she is to have any LOF or more bleeding to be seen at hospital for eval.   Pt states understanding.

## 2019-05-07 ENCOUNTER — Inpatient Hospital Stay (HOSPITAL_COMMUNITY)
Admission: AD | Admit: 2019-05-07 | Discharge: 2019-05-07 | Disposition: A | Discharge status: Home / Self Care | Payer: Medicaid Other | Attending: Obstetrics & Gynecology | Admitting: Obstetrics & Gynecology

## 2019-05-07 ENCOUNTER — Encounter (HOSPITAL_COMMUNITY): Payer: Self-pay | Admitting: Obstetrics & Gynecology

## 2019-05-07 ENCOUNTER — Other Ambulatory Visit: Payer: Self-pay

## 2019-05-07 DIAGNOSIS — O26893 Other specified pregnancy related conditions, third trimester: Secondary | ICD-10-CM | POA: Diagnosis not present

## 2019-05-07 DIAGNOSIS — O24419 Gestational diabetes mellitus in pregnancy, unspecified control: Secondary | ICD-10-CM | POA: Insufficient documentation

## 2019-05-07 DIAGNOSIS — Z3A36 36 weeks gestation of pregnancy: Secondary | ICD-10-CM | POA: Diagnosis not present

## 2019-05-07 DIAGNOSIS — N898 Other specified noninflammatory disorders of vagina: Secondary | ICD-10-CM | POA: Diagnosis not present

## 2019-05-07 DIAGNOSIS — O209 Hemorrhage in early pregnancy, unspecified: Secondary | ICD-10-CM | POA: Diagnosis not present

## 2019-05-07 DIAGNOSIS — Z87891 Personal history of nicotine dependence: Secondary | ICD-10-CM | POA: Insufficient documentation

## 2019-05-07 DIAGNOSIS — N939 Abnormal uterine and vaginal bleeding, unspecified: Secondary | ICD-10-CM | POA: Diagnosis present

## 2019-05-07 LAB — WET PREP, GENITAL
Clue Cells Wet Prep HPF POC: NONE SEEN
Sperm: NONE SEEN
Trich, Wet Prep: NONE SEEN
Yeast Wet Prep HPF POC: NONE SEEN

## 2019-05-07 LAB — URINALYSIS, ROUTINE W REFLEX MICROSCOPIC
Bilirubin Urine: NEGATIVE
Glucose, UA: NEGATIVE mg/dL
Ketones, ur: 5 mg/dL — AB
Leukocytes,Ua: NEGATIVE
Nitrite: NEGATIVE
Protein, ur: NEGATIVE mg/dL
Specific Gravity, Urine: 1.012 (ref 1.005–1.030)
pH: 6 (ref 5.0–8.0)

## 2019-05-07 NOTE — Discharge Instructions (Signed)

## 2019-05-07 NOTE — MAU Note (Signed)
Presents with c/o VB today, reports bright red with wiping.  Reports s/p VE on Tuesday.. Denies recent coitus.  Denies LOF.  Endorses +FM.

## 2019-05-07 NOTE — MAU Provider Note (Signed)
Patient Tricia Solomon is a 24 y.o. G1P0  at 76w3dhere with complaints of vaginal bleeding at 2:30 pm today. She denies LOF, decreased fetal movements, HA, vision changes, blurry vision, floating spots or RUQ pain. She noticed some pink on her toilet paper and decided to come in. She denies recent IC. She is a GDMA-1 on diet management.  History     CSN: [id number] Arrival date and time: 05/07/19 1613   First Provider Initiated Contact with Patient 05/07/19 1822      Chief Complaint  Patient presents with  . Vaginal Bleeding   Vaginal Bleeding The patient's primary symptoms include vaginal bleeding. This is a new problem. The current episode started today. The problem occurs rarely. The problem has been resolved. The patient is experiencing no pain. She is pregnant. Pertinent negatives include no abdominal pain, constipation, diarrhea, dysuria, fever, urgency or vomiting. The vaginal discharge was bloody. The vaginal bleeding is spotting. She has not been passing clots. She has not been passing tissue.  She noticed the bleeding on her toilet paper; it was bright pink. She did not have to change her underwear or put on a pantyliner.   OB History    Gravida  1   Para      Term      Preterm      AB      Living  0     SAB      TAB      Ectopic      Multiple      Live Births              Past Medical History:  Diagnosis Date  . Depression   . Diabetes mellitus without complication (HCC)    GDM  . Gestational diabetes     Past Surgical History:  Procedure Laterality Date  . NO PAST SURGERIES      Family History  Family history unknown: Yes    Social History   Tobacco Use  . Smoking status: Former SResearch scientist (life sciences) . Smokeless tobacco: Never Used  Substance Use Topics  . Alcohol use: Not Currently  . Drug use: Not Currently    Types: Marijuana    Comment: last used prior to pregnancy    Allergies: No Known Allergies  Medications Prior to Admission   Medication Sig Dispense Refill Last Dose  . Accu-Chek FastClix Lancets MISC 1 each by Percutaneous route 4 (four) times daily. 100 each 5 05/07/2019 at Unknown time  . Blood Pressure KIT MONITOR BP READINGS AT HOME REGULARLY O09.90 LARGE 1 kit 0 Past Month at Unknown time  . glucose blood (ACCU-CHEK GUIDE) test strip Use 1 test strip to check blood glucose 4 times daily 100 each 5 05/07/2019 at Unknown time  . Prenatal Vit-Fe Fumarate-FA (MULTIVITAMIN-PRENATAL) 27-0.8 MG TABS tablet Take 1 tablet by mouth daily at 12 noon.   05/06/2019 at Unknown time  . pyridoxine (B-6) 100 MG tablet Take 100 mg by mouth daily.     . sodium chloride (OCEAN) 0.65 % SOLN nasal spray Place 1 spray into both nostrils as needed for congestion. (Patient not taking: Reported on 10/13/2018) 1 Bottle 0     Review of Systems  Constitutional: Negative for fever.  Gastrointestinal: Negative for abdominal pain, constipation, diarrhea and vomiting.  Genitourinary: Positive for vaginal bleeding. Negative for dysuria and urgency.  Musculoskeletal: Negative.   Neurological: Negative.   Psychiatric/Behavioral: Negative.    Physical Exam   Blood pressure 134/80,  pulse (!) 116, temperature 98.8 F (37.1 C), temperature source Oral, resp. rate 20, height _0  (1.6 m), weight 101.2 kg, last menstrual period 08/25/2018, SpO2 99 %.  Physical Exam  Constitutional: She is oriented to person, place, and time. She appears well-developed.  HENT:  Head: Normocephalic.  Eyes: Pupils are equal, round, and reactive to light.  GI: Soft.  Genitourinary:    Genitourinary Comments: NEFG; no blood in the vagina, copious white-yellow mucous and discharge, no active bleeding, no lesions, no signs of infection. Cervix is 1 cm/long/thick.    Musculoskeletal:        General: Normal range of motion.     Cervical back: Normal range of motion.  Neurological: She is alert and oriented to person, place, and time.  Skin: Skin is warm and dry.     MAU Course  Procedures  MDM -NST; 135 bpm, mod var, present acel, neg decels, no contractions -wet prep negative -UA normal -GC pending No active bleeding while in MAU and no blood on exam; low suspicion for abruption.   Assessment and Plan   1. Vaginal discharge during pregnancy in third trimester    2. Patient stable for discharge; she plans to keep virtual visit on Tuesday.  -GC CT pending -Signs of labor and warning signs of 3rd trimester reviewed.  Mervyn Skeeters Karsten Howry 05/07/2019, 6:32 PM

## 2019-05-10 LAB — GC/CHLAMYDIA PROBE AMP (~~LOC~~) NOT AT ARMC
Chlamydia: NEGATIVE
Comment: NEGATIVE
Comment: NORMAL
Neisseria Gonorrhea: NEGATIVE

## 2019-05-12 ENCOUNTER — Telehealth (INDEPENDENT_AMBULATORY_CARE_PROVIDER_SITE_OTHER): Payer: Medicaid Other | Admitting: Obstetrics & Gynecology

## 2019-05-12 VITALS — BP 122/80 | HR 95

## 2019-05-12 DIAGNOSIS — Z3A37 37 weeks gestation of pregnancy: Secondary | ICD-10-CM

## 2019-05-12 DIAGNOSIS — Z3400 Encounter for supervision of normal first pregnancy, unspecified trimester: Secondary | ICD-10-CM

## 2019-05-12 DIAGNOSIS — O24410 Gestational diabetes mellitus in pregnancy, diet controlled: Secondary | ICD-10-CM

## 2019-05-12 NOTE — Progress Notes (Signed)
I connected with  Tricia Solomon on 05/12/19 by a video enabled telemedicine application and verified that I am speaking with the correct person using two identifiers.   I discussed the limitations of evaluation and management by telemedicine. The patient expressed understanding and agreed to proceed.  Mychart OB.  BS readings Fasting 90-94 / After meals 115-120. Reports no problems today.

## 2019-05-12 NOTE — Patient Instructions (Signed)

## 2019-05-12 NOTE — Progress Notes (Signed)
   TELEHEALTH VIRTUAL OBSTETRICS VISIT ENCOUNTER NOTE  I connected with [name] on 05/12/19 at  2:30 PM EST by telephone at home and verified that I am speaking with the correct person using two identifiers.   I discussed the limitations, risks, security and privacy concerns of performing an evaluation and management service by telephone and the availability of in person appointments. I also discussed with the patient that there may be a patient responsible charge related to this service. The patient expressed understanding and agreed to proceed.  Subjective:  Tricia Solomon is a 24 y.o. G1P0 at [redacted]w[redacted]d being followed for ongoing prenatal care.  She is currently monitored for the following issues for this high-risk pregnancy and has Depression with anxiety; Supervision of normal first pregnancy, antepartum; Obesity in pregnancy; and Diet controlled gestational diabetes mellitus (GDM) in third trimester on their problem list.  Patient reports and episode of bleeding was evaluated in MAU and no active bleeding was seen. Reports fetal movement. Denies any contractions, bleeding or leaking of fluid.   The following portions of the patient's history were reviewed and updated as appropriate: allergies, current medications, past family history, past medical history, past social history, past surgical history and problem list.   Objective:   General:  Alert, oriented and cooperative.   Mental Status: Normal mood and affect perceived. Normal judgment and thought content.  Rest of physical exam deferred due to type of encounter  Assessment and Plan:  Pregnancy: G1P0 at [redacted]w[redacted]d There are no diagnoses linked to this encounter. Term labor symptoms and general obstetric precautions including but not limited to vaginal bleeding, contractions, leaking of fluid and fetal movement were reviewed in detail with the patient.  I discussed the assessment and treatment plan with the patient. The patient was provided  an opportunity to ask questions and all were answered. The patient agreed with the plan and demonstrated an understanding of the instructions. The patient was advised to call back or seek an in-person office evaluation/go to MAU at [facility] for any urgent or concerning symptoms. Please refer to After Visit Summary for other counseling recommendations.  Diet controlled gestational diabetes mellitus (GDM) in third trimester  Supervision of normal first pregnancy, antepartum  BG with good control with diet, normal growth by [place] I provided 11 minutes of non-face-to-face time during this encounter.  Return in about 1 week (around 05/19/2019) for virtual.  Future Appointments  Date Time Provider Department Center  05/12/2019  2:30 PM Adam Phenix, MD [facility] None  05/18/2019  8:45 AM [facility] NURSE [facility]  05/18/2019  8:45 AM [facility] [place] 2 [facility]    Scheryl Darter, MD Center for [facility], [facility]

## 2019-05-14 ENCOUNTER — Other Ambulatory Visit: Payer: Self-pay

## 2019-05-14 ENCOUNTER — Encounter (HOSPITAL_COMMUNITY): Payer: Self-pay | Admitting: Obstetrics and Gynecology

## 2019-05-14 ENCOUNTER — Telehealth: Payer: Self-pay

## 2019-05-14 ENCOUNTER — Inpatient Hospital Stay (HOSPITAL_COMMUNITY)
Admission: AD | Admit: 2019-05-14 | Discharge: 2019-05-17 | DRG: 806 | Disposition: A | Discharge status: Home / Self Care | Payer: Medicaid Other | Attending: Obstetrics & Gynecology | Admitting: Obstetrics & Gynecology

## 2019-05-14 DIAGNOSIS — O24420 Gestational diabetes mellitus in childbirth, diet controlled: Secondary | ICD-10-CM | POA: Diagnosis not present

## 2019-05-14 DIAGNOSIS — O4292 Full-term premature rupture of membranes, unspecified as to length of time between rupture and onset of labor: Principal | ICD-10-CM | POA: Diagnosis present

## 2019-05-14 DIAGNOSIS — Z87891 Personal history of nicotine dependence: Secondary | ICD-10-CM | POA: Diagnosis not present

## 2019-05-14 DIAGNOSIS — Z3A37 37 weeks gestation of pregnancy: Secondary | ICD-10-CM

## 2019-05-14 DIAGNOSIS — Z20822 Contact with and (suspected) exposure to covid-19: Secondary | ICD-10-CM | POA: Diagnosis present

## 2019-05-14 DIAGNOSIS — O4290 Premature rupture of membranes, unspecified as to length of time between rupture and onset of labor, unspecified weeks of gestation: Secondary | ICD-10-CM | POA: Diagnosis present

## 2019-05-14 LAB — WET PREP, GENITAL
Clue Cells Wet Prep HPF POC: NONE SEEN
Sperm: NONE SEEN
Trich, Wet Prep: NONE SEEN
Yeast Wet Prep HPF POC: NONE SEEN

## 2019-05-14 LAB — CBC
HCT: 37.2 % (ref 36.0–46.0)
Hemoglobin: 12.2 g/dL (ref 12.0–15.0)
MCH: 30.2 pg (ref 26.0–34.0)
MCHC: 32.8 g/dL (ref 30.0–36.0)
MCV: 92.1 fL (ref 80.0–100.0)
Platelets: 211 10*3/uL (ref 150–400)
RBC: 4.04 MIL/uL (ref 3.87–5.11)
RDW: 13.8 % (ref 11.5–15.5)
WBC: 8.9 10*3/uL (ref 4.0–10.5)
nRBC: 0 % (ref 0.0–0.2)

## 2019-05-14 LAB — TYPE AND SCREEN
ABO/RH(D): O POS
Antibody Screen: NEGATIVE

## 2019-05-14 LAB — POCT FERN TEST: POCT Fern Test: NEGATIVE

## 2019-05-14 LAB — SARS CORONAVIRUS 2 (TAT 6-24 HRS): SARS Coronavirus 2: NEGATIVE

## 2019-05-14 LAB — GLUCOSE, CAPILLARY
Glucose-Capillary: 73 mg/dL (ref 70–99)
Glucose-Capillary: 80 mg/dL (ref 70–99)

## 2019-05-14 LAB — ABO/RH: ABO/RH(D): O POS

## 2019-05-14 MED ORDER — MISOPROSTOL 50MCG HALF TABLET
50.0000 ug | ORAL_TABLET | ORAL | Status: DC | PRN
  Administered 2019-05-14: 50 ug via BUCCAL
  Filled 2019-05-14 (×2): qty 1

## 2019-05-14 MED ORDER — LACTATED RINGERS IV SOLN: INTRAVENOUS | Status: DC

## 2019-05-14 MED ORDER — LACTATED RINGERS IV SOLN: 500.0000 mL | INTRAVENOUS | Status: DC | PRN

## 2019-05-14 MED ORDER — OXYTOCIN 40 UNITS IN NORMAL SALINE INFUSION - SIMPLE MED
2.5000 [IU]/h | INTRAVENOUS | Status: DC
  Filled 2019-05-14: qty 1000

## 2019-05-14 MED ORDER — SOD CITRATE-CITRIC ACID 500-334 MG/5ML PO SOLN: 30.0000 mL | ORAL | Status: DC | PRN

## 2019-05-14 MED ORDER — ACETAMINOPHEN 325 MG PO TABS: 650.0000 mg | ORAL_TABLET | ORAL | Status: DC | PRN

## 2019-05-14 MED ORDER — OXYTOCIN BOLUS FROM INFUSION
500.0000 mL | Freq: Once | INTRAVENOUS | Status: AC
  Administered 2019-05-15: 500 mL via INTRAVENOUS

## 2019-05-14 MED ORDER — TERBUTALINE SULFATE 1 MG/ML IJ SOLN: 0.2500 mg | Freq: Once | INTRAMUSCULAR | Status: DC | PRN

## 2019-05-14 MED ORDER — FENTANYL CITRATE (PF) 100 MCG/2ML IJ SOLN
50.0000 ug | INTRAMUSCULAR | Status: DC | PRN
  Administered 2019-05-14: 100 ug via INTRAVENOUS
  Administered 2019-05-15: 04:00:00 50 ug via INTRAVENOUS
  Administered 2019-05-15 (×2): 100 ug via INTRAVENOUS
  Filled 2019-05-14 (×4): qty 2

## 2019-05-14 MED ORDER — LIDOCAINE HCL (PF) 1 % IJ SOLN
30.0000 mL | INTRAMUSCULAR | Status: AC | PRN
  Administered 2019-05-15: 03:00:00 30 mL via SUBCUTANEOUS
  Filled 2019-05-14: qty 30

## 2019-05-14 MED ORDER — ONDANSETRON HCL 4 MG/2ML IJ SOLN: 4.0000 mg | Freq: Four times a day (QID) | INTRAMUSCULAR | Status: DC | PRN

## 2019-05-14 NOTE — MAU Note (Signed)
Had some clear fluid last night around 10p.m. When she got up twice this morning, had small gushes of fluid when she stood up,  light pink, also noted pink when she wiped.denies any pain. No active bleeding.

## 2019-05-14 NOTE — Telephone Encounter (Signed)
Return call to pt states she thinks her water may have broke Pt notes when getting up this morning she had fluid running down her legs And when wiping she noticed light pinkish blood. Pt denies any back pain, no nausea or vomiting, +FM, No contractions 3-5 mins apart Pt advised to go to MAU for an evaluation Pt agreeable.

## 2019-05-14 NOTE — H&P (Signed)
Tashanna Dolin is a 24 y.o. female presenting for PROM. OB History    Gravida  1   Para      Term      Preterm      AB      Living  0     SAB      TAB      Ectopic      Multiple      Live Births             Past Medical History:  Diagnosis Date  . Depression   . Diabetes mellitus without complication (HCC)    GDM  . Gestational diabetes    Past Surgical History:  Procedure Laterality Date  . NO PAST SURGERIES     Family History: family history includes Heart disease in her maternal grandmother and paternal grandfather. Social History:  reports that she has quit smoking. She has never used smokeless tobacco. She reports previous alcohol use. She reports previous drug use. Drug: Marijuana.     Maternal Diabetes: Yes:  Diabetes Type:  Diet controlled Genetic Screening: Normal Maternal Ultrasounds/Referrals: Normal Fetal Ultrasounds or other Referrals:  None Maternal Substance Abuse:  No Significant Maternal Medications:  None Significant Maternal Lab Results:  Group B Strep negative Other Comments:  None    Nursing Staff Provider  Office Location  [place]  Dating  LMP  Language  English Anatomy US  EIF, bilat CPC EFW 43%tile at 34 weeks (5lbs 2 oz)  Flu Vaccine  03-02-19 Genetic Screen  NIPS: low risk  AFP:       TDaP vaccine  03-02-19 Hgb A1C or  GTT Early 5.0 Third trimester  Ref. Range 03/02/2019 08:58  Glucose, 1 hour Latest Ref Range: 65 - 179 mg/dL 202 (H)  Glucose, Fasting Latest Ref Range: 65 - 91 mg/dL 91  Glucose, 2 hour Latest Ref Range: 65 - 152 mg/dL 177 (H)    Rhogam     LAB RESULTS   Feeding Plan BOTH  Blood Type O/Positive/-- (08/18 1049)   Contraception POPs Antibody Negative (08/18 1049)   Circumcision YES   Rubella 2.72 (08/18 1049)  Pediatrician  List given RPR Non Reactive (08/18 1049)  Support Person MOTHER  HBsAg Negative (08/18 1049)   Prenatal Classes Info given HIV Non Reactive (08/18 1049)  BTL Consent NA GBS  (For PCN  allergy, check sensitivities)   VBAC Consent NA Pap  11/10/18 - normal    Hgb Electro   horizon negative  BP Cuff yes CF  Horizon negative    SMA  Horizon negative    Waterbirth  [ ]  Class [ ]  Consent [ ]  CNM visit    Review of Systems  All other systems reviewed and are negative.  Maternal Medical History:  Reason for admission: Rupture of membranes.   Contractions: Onset was 13-24 hours ago.   Frequency: rare.   Perceived severity is mild.    Fetal activity: Perceived fetal activity is normal.   Last perceived fetal movement was within the past hour.    Prenatal Complications - Diabetes: gestational. Diabetes is managed by diet.      Dilation: 1.5 Effacement (%): 50 Station: -2 Exam by:: TLYTLE RN  Blood pressure 124/77, pulse 100, temperature 98.2 F (36.8 C), temperature source Oral, resp. rate 18, height 5\' 3"  (1.6 m), weight 101.6 kg, last menstrual period 08/25/2018, SpO2 99 %. Maternal Exam:  Uterine Assessment: Contraction strength is mild.  Contraction frequency is rare.  Abdomen: Patient reports no abdominal tenderness. Fetal presentation: vertex  Introitus: Normal vulva. Normal vagina.  Cervix: Cervix evaluated by sterile speculum exam.     Fetal Exam Fetal Monitor Review: Mode: ultrasound.   Baseline rate: 120.  Variability: moderate (6-25 bpm).   Pattern: accelerations present and no decelerations.    Fetal State Assessment: Category I - tracings are normal.     Physical Exam  Nursing note and vitals reviewed. Constitutional: She is oriented to person, place, and time. She appears well-developed and well-nourished. No distress.  HENT:  Head: Normocephalic.  Cardiovascular: Normal rate.  Respiratory: Effort normal.  GI: Soft. There is no abdominal tenderness. There is no rebound.  Genitourinary:    Vulva normal.   Neurological: She is alert and oriented to person, place, and time.  Skin: Skin is warm and dry.  Psychiatric: She has a normal  mood and affect.    Prenatal labs: ABO, Rh: --/--/PENDING (02/19 1745) Antibody: PENDING (02/19 1745) Rubella: 2.72 (08/18 1049) RPR: Non Reactive (12/08 0858)  HBsAg: Negative (08/18 1049)  HIV: Non Reactive (12/08 0858)  GBS: --Theda Sers (02/09 1116)   Assessment/Plan: 24 y.o. G1P0 at [redacted]w[redacted]d  A1GDM PROM GBS negative Admit to labor and delivery  Q 4 hour CBG  DW patient at length options including immediate IOL or expectant management. She is not ready to start IOL at this time. She is having some contractions she is not feeling. So we will have her ambulate the room. If no significant change in contraction pattern over the next few hours will consider FB and/or cytotec  Thressa Sheller DNP, CNM  05/14/19  7:09 PM

## 2019-05-14 NOTE — Progress Notes (Signed)
Tricia Solomon is a 24 y.o. G1P0 at [redacted]w[redacted]d admitted for PROM  Subjective: Nervous about how long she has been [facility] for, willing to start cervical ripening at this time. Has not been using the birthing ball or doing nipple stimulation.  Objective: BP 121/72   Pulse 97   Temp 98.5 F (36.9 C) (Oral)   Resp 16   Ht 5\' 3"  (1.6 m)   Wt 101.6 kg   LMP 08/25/2018   SpO2 98%   BMI 39.68 kg/m  No intake/output data recorded.  FHT:  FHR: 125 bpm, variability: moderate,  accelerations:  Present,  decelerations:  Absent UC:   none  SVE:   Dilation: 1.5 Effacement (%): 50 Station: -2 Exam by:: Dr. [email or web address]   Labs: Lab Results  Component Value Date   WBC 8.9 05/14/2019   HGB 12.2 05/14/2019   HCT 37.2 05/14/2019   MCV 92.1 05/14/2019   PLT 211 05/14/2019    Assessment / Plan: 24 yo G1P0 at 79.3 EGA with PROM.  Labor: Willing to start cervical ripening. FB placed at this check without difficulty. Cytotec given. Will hold off on cervical checks until FB out as she has been [facility] for almost 24 hours at this point. Consider cytotec vs pitocin when FB out. Fetal Wellbeing:  Category I Pain Control:  per patient request I/D:  GBS negative Anticipated MOD:  vaginal  Hailey L Sparacino DO OB Fellow, Faculty Practice 05/14/2019, 9:03 PM

## 2019-05-14 NOTE — MAU Provider Note (Signed)
History     [id number]  Arrival date and time: 05/14/19 1508    Chief Complaint  Patient presents with  . Rupture of Membranes     HPI Tricia Solomon is a 24 y.o. at 74w3dby LMP c/w 19wk [place] PM[place] notable for GDMA1, who presents for possible ROM.   Last MAU visit 05/07/2019 for vaginal discharge/bleeding, reactive NST, not in labor, wet prep/GC/CT all normal  Today reports trickle of fluid that was clear around 2200 last night and again this morning around 0800 that was pink tinged  Vaginal bleeding: No LOF: Yes Fetal Movement: Yes Contractions: No  O/Positive/-- (08/18 1049)  OB History    Gravida  1   Para      Term      Preterm      AB      Living  0     SAB      TAB      Ectopic      Multiple      Live Births              Past Medical History:  Diagnosis Date  . Depression   . Diabetes mellitus without complication (HCC)    GDM  . Gestational diabetes     Past Surgical History:  Procedure Laterality Date  . NO PAST SURGERIES      Family History  Family history unknown: Yes    Social History   Socioeconomic History  . Marital status: Single    Spouse name: Not on file  . Number of children: Not on file  . Years of education: Not on file  . Highest education level: Not on file  Occupational History  . Not on file  Tobacco Use  . Smoking status: Former SResearch scientist (life sciences) . Smokeless tobacco: Never Used  Substance and Sexual Activity  . Alcohol use: Not Currently  . Drug use: Not Currently    Types: Marijuana    Comment: last used prior to pregnancy  . Sexual activity: Yes  Other Topics Concern  . Not on file  Social History Narrative  . Not on file   Social Determinants of Health   Financial Resource Strain:   . Difficulty of Paying Living Expenses: Not on file  Food Insecurity:   . Worried About RCharity fundraiserin the Last Year: Not on file  . Ran Out of Food in the Last Year: Not on file  Transportation Needs:   .  Lack of Transportation (Medical): Not on file  . Lack of Transportation (Non-Medical): Not on file  Physical Activity:   . Days of Exercise per Week: Not on file  . Minutes of Exercise per Session: Not on file  Stress:   . Feeling of Stress : Not on file  Social Connections:   . Frequency of Communication with Friends and Family: Not on file  . Frequency of Social Gatherings with Friends and Family: Not on file  . Attends Religious Services: Not on file  . Active Member of Clubs or Organizations: Not on file  . Attends CArchivistMeetings: Not on file  . Marital Status: Not on file  Intimate Partner Violence:   . Fear of Current or Ex-Partner: Not on file  . Emotionally Abused: Not on file  . Physically Abused: Not on file  . Sexually Abused: Not on file    No Known Allergies  No current facility-administered medications on file prior to encounter.  Current Outpatient Medications on File Prior to Encounter  Medication Sig Dispense Refill  . Prenatal Vit-Fe Fumarate-FA (MULTIVITAMIN-PRENATAL) 27-0.8 MG TABS tablet Take 1 tablet by mouth daily at 12 noon.    . Accu-Chek FastClix Lancets MISC 1 each by Percutaneous route 4 (four) times daily. 100 each 5  . Blood Pressure KIT MONITOR BP READINGS AT HOME REGULARLY O09.90 LARGE 1 kit 0  . glucose blood (ACCU-CHEK GUIDE) test strip Use 1 test strip to check blood glucose 4 times daily 100 each 5  . pyridoxine (B-6) 100 MG tablet Take 100 mg by mouth daily.       ROS Complete ROS completed and otherwise negative except as noted in HPI  Physical Exam   BP 128/80 (BP Location: Right Arm)   Pulse (!) 106   Temp 98.5 F (36.9 C) (Oral)   Resp 16   Ht 5' 3"  (1.6 m)   Wt 102 kg   LMP 08/25/2018   SpO2 99%   BMI 39.84 kg/m   Physical Exam  Vitals reviewed. Constitutional: She appears well-developed and well-nourished. No distress.  Eyes: No scleral icterus.  Respiratory: Effort normal. No respiratory distress.   GI: Soft. She exhibits no distension. There is no abdominal tenderness. There is no rebound and no guarding.  Genitourinary:    Genitourinary Comments: Scant amount of pooling, +leak from cervical os with valsalva, friable appearing cervix, no discharge appreciated   Musculoskeletal:        General: No edema.  Neurological: She is alert. Coordination normal.  Skin: Skin is warm and dry. She is not diaphoretic.  Psychiatric: She has a normal mood and affect.    Bedside Ultrasound Not done  FHT Baseline 135, moderate variability, +accels x3, -decels Toco irritability with rare contractions Cat I  Labs Fern: positive  Imaging None  MAU Course  Procedures Fern Wet prep GC/CT  MDM Moderate  Assessment and Plan  #SROM Patient with scant amount of pooling and positive fern, recommended admission for delivery to which she is amenable. Signed out to labor team, admission orders placed.   #FWB FHT Cat I NST Reactive  Clarnce Flock

## 2019-05-15 ENCOUNTER — Encounter (HOSPITAL_COMMUNITY): Payer: Self-pay | Admitting: Family Medicine

## 2019-05-15 DIAGNOSIS — Z3A37 37 weeks gestation of pregnancy: Secondary | ICD-10-CM

## 2019-05-15 DIAGNOSIS — O24420 Gestational diabetes mellitus in childbirth, diet controlled: Secondary | ICD-10-CM

## 2019-05-15 LAB — RPR: RPR Ser Ql: NONREACTIVE

## 2019-05-15 MED ORDER — LACTATED RINGERS IV SOLN: 500.0000 mL | Freq: Once | INTRAVENOUS | Status: DC

## 2019-05-15 MED ORDER — DIPHENHYDRAMINE HCL 50 MG/ML IJ SOLN: 12.5000 mg | INTRAMUSCULAR | Status: DC | PRN

## 2019-05-15 MED ORDER — DIBUCAINE (PERIANAL) 1 % EX OINT
1.0000 "application " | TOPICAL_OINTMENT | CUTANEOUS | Status: DC | PRN
  Administered 2019-05-15: 1 via RECTAL
  Filled 2019-05-15: qty 28

## 2019-05-15 MED ORDER — ONDANSETRON HCL 4 MG PO TABS: 4.0000 mg | ORAL_TABLET | ORAL | Status: DC | PRN

## 2019-05-15 MED ORDER — TETANUS-DIPHTH-ACELL PERTUSSIS 5-2.5-18.5 LF-MCG/0.5 IM SUSP: 0.5000 mL | Freq: Once | INTRAMUSCULAR | Status: DC

## 2019-05-15 MED ORDER — EPHEDRINE 5 MG/ML INJ: 10.0000 mg | INTRAVENOUS | Status: DC | PRN

## 2019-05-15 MED ORDER — ONDANSETRON HCL 4 MG/2ML IJ SOLN: 4.0000 mg | INTRAMUSCULAR | Status: DC | PRN

## 2019-05-15 MED ORDER — FENTANYL-BUPIVACAINE-NACL 0.5-0.125-0.9 MG/250ML-% EP SOLN
12.0000 mL/h | EPIDURAL | Status: DC | PRN
  Filled 2019-05-15: qty 250

## 2019-05-15 MED ORDER — WITCH HAZEL-GLYCERIN EX PADS: 1.0000 "application " | MEDICATED_PAD | CUTANEOUS | Status: DC | PRN

## 2019-05-15 MED ORDER — PRENATAL MULTIVITAMIN CH
1.0000 | ORAL_TABLET | Freq: Every day | ORAL | Status: DC
  Administered 2019-05-15 – 2019-05-17 (×3): 1 via ORAL
  Filled 2019-05-15 (×3): qty 1

## 2019-05-15 MED ORDER — DIPHENHYDRAMINE HCL 25 MG PO CAPS: 25.0000 mg | ORAL_CAPSULE | Freq: Four times a day (QID) | ORAL | Status: DC | PRN

## 2019-05-15 MED ORDER — SIMETHICONE 80 MG PO CHEW: 80.0000 mg | CHEWABLE_TABLET | ORAL | Status: DC | PRN

## 2019-05-15 MED ORDER — ZOLPIDEM TARTRATE 5 MG PO TABS: 5.0000 mg | ORAL_TABLET | Freq: Every evening | ORAL | Status: DC | PRN

## 2019-05-15 MED ORDER — SENNOSIDES-DOCUSATE SODIUM 8.6-50 MG PO TABS
2.0000 | ORAL_TABLET | ORAL | Status: DC
  Administered 2019-05-15 – 2019-05-17 (×2): 2 via ORAL
  Filled 2019-05-15 (×2): qty 2

## 2019-05-15 MED ORDER — COCONUT OIL OIL
1.0000 "application " | TOPICAL_OIL | Status: DC | PRN
  Administered 2019-05-17: 1 via TOPICAL

## 2019-05-15 MED ORDER — ACETAMINOPHEN 325 MG PO TABS
650.0000 mg | ORAL_TABLET | ORAL | Status: DC | PRN
  Administered 2019-05-15 – 2019-05-17 (×2): 650 mg via ORAL
  Filled 2019-05-15 (×2): qty 2

## 2019-05-15 MED ORDER — IBUPROFEN 600 MG PO TABS
600.0000 mg | ORAL_TABLET | Freq: Four times a day (QID) | ORAL | Status: DC
  Administered 2019-05-15 – 2019-05-17 (×9): 600 mg via ORAL
  Filled 2019-05-15 (×9): qty 1

## 2019-05-15 MED ORDER — BENZOCAINE-MENTHOL 20-0.5 % EX AERO
1.0000 "application " | INHALATION_SPRAY | CUTANEOUS | Status: DC | PRN
  Administered 2019-05-15: 1 via TOPICAL
  Filled 2019-05-15: qty 56

## 2019-05-15 MED ORDER — PHENYLEPHRINE 40 MCG/ML (10ML) SYRINGE FOR IV PUSH (FOR BLOOD PRESSURE SUPPORT)
80.0000 ug | PREFILLED_SYRINGE | INTRAVENOUS | Status: DC | PRN
  Filled 2019-05-15: qty 10

## 2019-05-15 MED ORDER — PHENYLEPHRINE 40 MCG/ML (10ML) SYRINGE FOR IV PUSH (FOR BLOOD PRESSURE SUPPORT): 80.0000 ug | PREFILLED_SYRINGE | INTRAVENOUS | Status: DC | PRN

## 2019-05-15 NOTE — Discharge Instructions (Signed)

## 2019-05-15 NOTE — Lactation Note (Signed)
This note was copied from a baby's chart. Lactation Consultation Note  Patient Name: Tricia Solomon [id number] Date: 05/15/2019 Reason for consult: Initial assessment;Primapara;1st time breastfeeding;Early term 37-38.6wks  Initial assessment, Parents (Mom and Dad) both awake when [employer] and [facility] [employer] student entered the room. Infant is asleep in the bassinet. Mom, P1 stated she attempted breastfeeding but no colostrum visible. Mom participated in [facility] with [facility].   11 hours old ETI- 37.4 weeks, 6lbs 9 oz is asleep in the bassinet. [facility] student educated mom on hand expression, breast compressions, and STS. [facility] student attempted hand expression on left and right breast- no colostrum present at either breast during this time. Mother has hx of GDM during pregnancy. Infant has serum glucose of 56 and 61.   Mom reported infant has fed at the breast but she's unsure if he is getting anything. Mom stated she gave infant 2 mls of formula. LC encouraged mom to continue to breastfeed first then supplement per feeding choice. LC educated mom on the importance of not overfeeding infant. Mom asked if [facility] student can assist her putting infant to the breast. LC student placed infant in football position at the right breast. Attempted to feed but infant seems full and uninterested in feeding at this time. [facility] student encouraged mom to watch for feeding cues and breastfeed him soon as cues are present. Mom stated she understood and plans to do so. While conversing, infant became very spitty and burpy. [facility] student encouraged mom to keep infant STS and in a upright position to help avoid infant spitting up while laying down.   Breastfeeding basics, hand expression, breast compressions, feeding cues and frequency, supplementation guidelines when feeding infant at the breast, and STS was reviewed with mom. LC student encouraged Mom to call LC if needed. Mom is aware and stated she will do so. No further questions or concerns from Mom and Dad at  this time.   Feeding plan: -STS as much as possible -Feed infant 8-12 x's per day or according to feeding cues - Hand expression, spoon feed EBM/Formula when needed according to supplementation guidelines  Maternal Data Formula Feeding for Exclusion: Yes Reason for exclusion: Mother's choice to formula and breast feed on admission Has patient been taught Hand Expression?: Yes Does the patient have breastfeeding experience prior to this delivery?: No  Feeding Feeding Type: Breast Fed Nipple Type: Slow - flow  LATCH Score Latch: Too sleepy or reluctant, no latch achieved, no sucking elicited.                 Interventions Interventions: Breast feeding basics reviewed;Support pillows;Assisted with latch;Skin to skin;Breast massage;Hand express;Breast compression;Hand pump  Lactation Tools Discussed/Used Tools: Pump Breast pump type: Manual WIC Program: Yes Pump Review: Setup, frequency, and cleaning Initiated by:: RN Date initiated:: 05/15/19   Consult Status Consult Status: Follow-up Date: 05/16/19 Follow-up type: In-patient    Tricia Solomon 05/15/2019, 3:32 PM

## 2019-05-15 NOTE — Lactation Note (Signed)
This note was copied from a baby's chart. Lactation Consultation Note  Patient Name: Tricia Solomon [id number] Date: 05/15/2019 Reason for consult: Initial assessment;Primapara;1st time breastfeeding;Early term 37-38.6wks  I agree with the assessment and feeding plan of [employer], [facility] student, I was present during the time of [facility] consultation.   Maternal Data Formula Feeding for Exclusion: Yes Reason for exclusion: Mother's choice to formula and breast feed on admission Has patient been taught Hand Expression?: Yes Does the patient have breastfeeding experience prior to this delivery?: No  Feeding Feeding Type: Breast Fed  LATCH Score                   Interventions Interventions: Breast feeding basics reviewed;Support pillows;Assisted with latch;Skin to skin;Breast massage;Hand express;Breast compression;Hand pump  Lactation Tools Discussed/Used Tools: Pump Breast pump type: Manual WIC Program: Yes Pump Review: Setup, frequency, and cleaning Initiated by:: RN Date initiated:: 05/15/19   Consult Status Consult Status: Follow-up Date: 05/16/19 Follow-up type: In-patient    Rebecca Motta Venetia Constable 05/15/2019, 4:09 PM

## 2019-05-15 NOTE — Discharge Summary (Signed)
Postpartum Discharge Summary  Date of Service updated-yes     Patient Name: Tricia Solomon DOB: 1995/04/16 MRN: [id number]  Date of admission: 05/14/2019 Delivering Provider: Merilyn Baba   Date of discharge: 05/17/2019  Admitting diagnosis: ROM (rupture of membranes), premature [O42.90] Intrauterine pregnancy: [redacted]w[redacted]d    Secondary diagnosis:  Active Problems:   ROM (rupture of membranes), premature   SVD (spontaneous vaginal delivery)  Additional problems: A1GDM     Discharge diagnosis: Term Pregnancy Delivered                                                                                                Post partum procedures:none  Augmentation: Cytotec and Foley Balloon  Complications: None  Hospital course:  Induction of Labor With Vaginal Delivery   24y.o. yo G1P0 at 333w4das admitted to the hospital 05/14/2019 for induction of labor.  Indication for induction: PROM.  Patient had an uncomplicated labor course as follows:  Patient admitted for PROM that occurred on 2/18 at approximately 10 PM. She was induced with cytotec x1 and FB. She progressed from there without further augmentation to complete and delivered shortly after.  Membrane Rupture Time/Date: 10:00 PM ,05/13/2019   Intrapartum Procedures: Episiotomy: None [1]                                         Lacerations:  2nd degree [3];Sulcus [9]  Patient had delivery of a Viable infant.  Information for the patient's newborn:  HeTia, Hieronymus0[[id number]]Delivery Method: Vaginal, Spontaneous(Filed from Delivery Summary)    05/15/2019  Details of delivery can be found in separate delivery note.  Patient had a routine postpartum course. Patient is discharged home 05/17/19. Delivery time: 3:00 AM   Magnesium Sulfate received: No BMZ received: No Rhophylac:N/A MMR:N/A Transfusion:No  Physical exam  Vitals:   05/16/19 0512 05/16/19 1449 05/16/19 2120 05/17/19 0514  BP: 112/71 123/87 118/72 116/69   Pulse: 97 (!) 101 (!) 102 86  Resp: 16 20  16   Temp: 98.9 F (37.2 C) 98.9 F (37.2 C) 98.4 F (36.9 C) 97.8 F (36.6 C)  TempSrc: Oral Oral Oral Oral  SpO2:   98% 100%  Weight:      Height:       General: alert, cooperative and no distress Lochia: appropriate Uterine Fundus: firm Incision: Healing well with no significant drainage, No significant erythema DVT Evaluation: No evidence of DVT seen on physical exam. Negative Homan's sign. No cords or calf tenderness. No significant calf/ankle edema.  Labs: Lab Results  Component Value Date   WBC 10.0 05/16/2019   HGB 9.6 (L) 05/16/2019   HCT 29.6 (L) 05/16/2019   MCV 94.3 05/16/2019   PLT 167 05/16/2019   CMP Latest Ref Rng & Units 05/08/2018  Glucose 65 - 99 mg/dL 78  BUN 7 - 25 mg/dL 9  Creatinine 0.50 - 1.10 mg/dL 0.66  Sodium 135 - 146 mmol/L 138  Potassium 3.5 - 5.3 mmol/L 4.0  Chloride 98 - 110 mmol/L 104  CO2 20 - 32 mmol/L 24  Calcium 8.6 - 10.2 mg/dL 9.0  Total Protein 6.1 - 8.1 g/dL 6.9  Total Bilirubin 0.2 - 1.2 mg/dL 1.0  AST 10 - 30 U/L 11  ALT 6 - 29 U/L 13   Edinburgh Score: Edinburgh Postnatal Depression Scale Screening Tool 05/17/2019  I have been able to laugh and see the funny side of things. 0  I have looked forward with enjoyment to things. 0  I have blamed myself unnecessarily when things went wrong. 2  I have been anxious or worried for no good reason. 2  I have felt scared or panicky for no good reason. 0  Things have been getting on top of me. 2  I have been so unhappy that I have had difficulty sleeping. 0  I have felt sad or miserable. 1  I have been so unhappy that I have been crying. 1  The thought of harming myself has occurred to me. 0  Edinburgh Postnatal Depression Scale Total 8    Discharge instruction: per After Visit Summary and "Baby and Me Booklet".  After visit meds:  Allergies as of 05/17/2019   No Known Allergies     Medication List    STOP taking these  medications   Accu-Chek FastClix Lancets Misc   Accu-Chek Guide test strip Generic drug: glucose blood   Blood Pressure Kit   pyridoxine 100 MG tablet Commonly known as: B-6     TAKE these medications   ibuprofen 600 MG tablet Commonly known as: ADVIL Take 1 tablet (600 mg total) by mouth every 6 (six) hours.   multivitamin-prenatal 27-0.8 MG Tabs tablet Take 1 tablet by mouth daily at 12 noon.      Diet: routine diet  Activity: Advance as tolerated. Pelvic rest for 6 weeks.   Outpatient follow up:4 weeks Follow up Appt: Future Appointments  Date Time Provider [place]  05/18/2019  8:45 AM [place] Baggs [facility]  05/18/2019  8:45 AM [place] 2 WH-MFCUS [facility]  05/19/2019  2:30 PM Shelly Bombard, MD [place] None   Follow up Visit: [place]. Go in 6 week(s).   Contact information: [address] [place] [address] [phone]         Please schedule this patient for Postpartum visit in: 4 weeks with the following provider: Any provider For C/S patients schedule nurse incision check in weeks 2 weeks: no Low risk pregnancy complicated by: PROM Delivery mode:  SVD Anticipated Birth Control:  POPs PP Procedures needed: None  Schedule Integrated BH visit: no  Newborn Data: Live born female  Birth Weight:  6'9 APGAR: 35, 9  Newborn Delivery   Birth date/time: 05/15/2019 03:00:00 Delivery type: Vaginal, Spontaneous     Baby Feeding: Breast Disposition:home with mother  05/17/2019 Julianne Handler, CNM

## 2019-05-15 NOTE — Progress Notes (Signed)
Tricia Solomon is a 24 y.o. G1P0 at [redacted]w[redacted]d admitted for PROM  Subjective: Starting to feel contractions more, FB out  Objective: BP 125/74   Pulse 89   Temp 98.4 F (36.9 C) (Oral)   Resp 15   Ht 5\' 3"  (1.6 m)   Wt 101.6 kg   LMP 08/25/2018   SpO2 98%   BMI 39.68 kg/m  No intake/output data recorded.  FHT:  FHR: 130 bpm, variability: moderate,  accelerations:  Present,  decelerations:  Present - occasional variable UC:   regular, every 2-3 minutes  SVE:   5/70/-1  Labs: Lab Results  Component Value Date   WBC 8.9 05/14/2019   HGB 12.2 05/14/2019   HCT 37.2 05/14/2019   MCV 92.1 05/14/2019   PLT 211 05/14/2019    Assessment / Plan: 24 yo G1P0 at 52.3 EGA with PROM.  Labor: S/p FB and cyto x1. Cervix still with firm consistency, contracting regularly off one dose of cytotec. Consider Pit at next check. Fetal Wellbeing:  Category I Pain Control:  per patient request I/D:  GBS negative Anticipated MOD:  vaginal  Tricia Solomon L Rita Prom DO OB Fellow, [facility] 05/15/2019, 1:12 AM

## 2019-05-16 LAB — CBC
HCT: 29.6 % — ABNORMAL LOW (ref 36.0–46.0)
Hemoglobin: 9.6 g/dL — ABNORMAL LOW (ref 12.0–15.0)
MCH: 30.6 pg (ref 26.0–34.0)
MCHC: 32.4 g/dL (ref 30.0–36.0)
MCV: 94.3 fL (ref 80.0–100.0)
Platelets: 167 10*3/uL (ref 150–400)
RBC: 3.14 MIL/uL — ABNORMAL LOW (ref 3.87–5.11)
RDW: 14.2 % (ref 11.5–15.5)
WBC: 10 10*3/uL (ref 4.0–10.5)
nRBC: 0 % (ref 0.0–0.2)

## 2019-05-16 MED ORDER — FERROUS SULFATE 325 (65 FE) MG PO TABS
325.0000 mg | ORAL_TABLET | Freq: Two times a day (BID) | ORAL | Status: DC
  Administered 2019-05-16 – 2019-05-17 (×2): 325 mg via ORAL
  Filled 2019-05-16 (×2): qty 1

## 2019-05-16 MED ORDER — HYDROXYZINE HCL 25 MG PO TABS: 25.0000 mg | ORAL_TABLET | Freq: Three times a day (TID) | ORAL | Status: DC | PRN

## 2019-05-16 MED ORDER — SERTRALINE HCL 25 MG PO TABS
25.0000 mg | ORAL_TABLET | Freq: Every day | ORAL | Status: DC
  Administered 2019-05-16 – 2019-05-17 (×2): 25 mg via ORAL
  Filled 2019-05-16 (×2): qty 1

## 2019-05-16 NOTE — Progress Notes (Signed)
Patient very tearful when RN entered room. RN sat bedside and comforted patient. Discussed her feelings of "being sad for no reason". Patient couldn't relate her feelings to anything in particular. We openly discussed patient hx of anxiety and depression, new postpartum mood & hormonal changes . Patient says she was previously on Paxil and Seroquel but discontinued for other reasons. Honestly discussed with patient that maybe starting medication therapy will be a good option. RN called Dr. Dareen Piano to discuss medication therapy to help patient. Orders will be put in.  Patient was also tearful yesterday with no relation of knowing why she was tearful.  Newman Pies, RN

## 2019-05-16 NOTE — Progress Notes (Signed)
INTERIM PROGRESS NOTE:   Patient has been tearful "for no reason", crying frequently. Has Hx of Anxiety/Depression but stopped meds due to concerns for pregnancy. Patient used to be on Paxil and Seroquel, had BH therapist in [place] but stopped seeing them due to driving distance.   Was seen here by Social Work (please see 05/16/2019 note). Gave patient info regarding baby blues vs perinatal mood disorders, discussed treatment and gave patient resources for mental health follow up. Edinburgh filled out, not yet recorded.  Mom amendable to starting meds for mood. Will start Zoloft 25mg  daily scheduled and Vistaril 25mg  TID PRN.    , DO [facility], PGY-2 05/16/2019 2:31 PM

## 2019-05-16 NOTE — Progress Notes (Signed)
CSW received consult for hx of Anxiety and Depression.  CSW met with MOB to offer support and complete assessment.    CSW visited MOB at bedside to discuss mental health history. FOB and infant Kieron were present at time of visit. While Kieron remained in MOB arms, FOB stepped out of room to offer MOB privacy. MOB reported history of depression and anxiety. When asked to identify current mood, MOB stated "Happy and Relaxed".  MOB reported taking three different medications in the past to treat dep/anx. MOB reports decision to discontinue prepregnancy due to medication safety concerns, however, today, stated she will reconsider medication if concerns with depression and anxiety present again. MOB reported previously seeing a BH therapist in [place] but discontinued  sessions due to driving distance. MOB stated interested in locating a new therapist. CSW provided MOB with resource list.  MOB denied any current SI, HI, or domestic violence.   CSW provided education regarding the baby blues period vs. perinatal mood disorders, discussed treatment and gave resources for mental health follow up if concerns arise.  CSW recommends self-evaluation during the postpartum time period using the New Mom Checklist from Postpartum Progress and encouraged MOB to contact a medical professional if symptoms are noted at any time.    CSW provided review of Sudden Infant Death Syndrome (SIDS) precautions. MOB confirmed having all needed items for baby including car seat and bassinet for safe sleeping space.    CSW identifies no further need for intervention and no barriers to discharge at this time.  Leonard Hendler D. Janeese Mcgloin, MSW, LCSWA Clinical Social Worker [phone] 

## 2019-05-16 NOTE — Lactation Note (Signed)
This note was copied from a baby's chart. Lactation Consultation Note  Patient Name: Tricia Solomon BFXOV'[id number] Date: 05/16/2019 Reason for consult: Follow-up assessment  P1 mother whose infant is now 44 hours old.  This is an ETI at 37+4 weeks  Mother was crying when I arrived.  In speaking with her it appears that she is sad due to lack of sleep, baby not feeding well and being somewhat overwhelmed at this time.  She was holding baby and he was asleep in her arms.  He last fed at 1140.  Provided emotional support and suggested she call me to return for latch assistance when he begins to awaken.  She has been feeding him every three hours and I asked her to continue awakening him if he does not self awaken by three hours.  Encouraged to feed more often if he shows feeding cues.  I will return when mother calls for my assistance.  Father present.   Maternal Data    Feeding Feeding Type: Bottle Fed - Formula Nipple Type: Slow - flow  LATCH Score                   Interventions    Lactation Tools Discussed/Used     Consult Status Consult Status: Follow-up Date: 05/16/19 Follow-up type: In-patient    Cam Harnden R Lummie Montijo 05/16/2019, 2:00 PM

## 2019-05-16 NOTE — Progress Notes (Signed)
Post Partum Day 1  Subjective: no complaints, up ad lib, voiding, tolerating PO and + flatus. Seen resting in bed with FOB at bed side.   Objective: Blood pressure 112/71, pulse 97, temperature 98.9 F (37.2 C), temperature source Oral, resp. rate 16, height 5\' 3"  (1.6 m), weight 101.6 kg, last menstrual period 08/25/2018, SpO2 98 %, unknown if currently breastfeeding.  Physical Exam:  General: alert, cooperative, appears stated age and no distress, very pleasant patient Lochia: appropriate Uterine Fundus: firm Incision: N/A DVT Evaluation: No evidence of DVT seen on physical exam. No significant calf/ankle edema.  Recent Labs    05/14/19 1745 05/16/19 0518  HGB 12.2 9.6*  HCT 37.2 29.6*    Assessment/Plan: Plan for discharge tomorrow and Contraception POPs   LOS: 2 days    Tricia Solomon 05/16/2019, 8:18 AM

## 2019-05-17 ENCOUNTER — Telehealth: Payer: Self-pay

## 2019-05-17 ENCOUNTER — Other Ambulatory Visit: Payer: Self-pay | Admitting: Obstetrics and Gynecology

## 2019-05-17 LAB — GC/CHLAMYDIA PROBE AMP (~~LOC~~) NOT AT ARMC
Chlamydia: NEGATIVE
Comment: NEGATIVE
Comment: NORMAL
Neisseria Gonorrhea: NEGATIVE

## 2019-05-17 MED ORDER — SERTRALINE HCL 25 MG PO TABS: 25.0000 mg | ORAL_TABLET | Freq: Every day | ORAL | 3 refills | Status: DC

## 2019-05-17 MED ORDER — IBUPROFEN 600 MG PO TABS: 600.0000 mg | ORAL_TABLET | Freq: Four times a day (QID) | ORAL | 0 refills | Status: AC

## 2019-05-17 NOTE — Telephone Encounter (Signed)
Patient called and reports she delivered her baby at the hospital this weekend and she is now home. She reports that while in the hospital she was being given Zoloft 25mg  and states that she was supposed to call once discharged to get a provider here to prescribe that medication for her.

## 2019-05-17 NOTE — Lactation Note (Signed)
This note was copied from a baby's chart. Lactation Consultation Note  Patient Name: Tricia Solomon [id number] Date: 05/17/2019   P1, Baby 54 hours.  Mother has hx of depression.  Mother is taking Zoloft (L2) and Vistaril (L2) per "[facility] Medications & Mother's Milk" are probably compatible.   Mother states she wants to pump and bottle feed for now. She is supplementing with formula.  Discussed milk storage.  She states her nipples feel dry and tender. No cracks or abrasions. Provided mother w/ coconut oil and discussed lubricating pump flanges. Mother has personal DEBP at home.  Discussed converting DEBP kit. Feed on demand with cues.  Goal 8-12+ times per day after first 24 hrs.  Place baby STS if not cueing.  Encouraged her to pump 8 times per day to establish her milk supply. Reviewed engorgement care and monitoring voids/stools. Mother denies questions or concerns or needing assistance with pumping. Encouraged mother to consider OP lactation appt is she decided to work on Engineer, structural.     Maternal Data    Feeding    LATCH Score                   Interventions    Lactation Tools Discussed/Used     Consult Status      Vivianne Master [facility] 05/17/2019, 9:23 AM

## 2019-05-18 ENCOUNTER — Ambulatory Visit (HOSPITAL_COMMUNITY): Payer: Medicaid Other

## 2019-05-18 ENCOUNTER — Encounter (HOSPITAL_COMMUNITY): Payer: Self-pay

## 2019-05-19 ENCOUNTER — Telehealth: Payer: Medicaid Other | Admitting: Obstetrics

## 2019-05-20 ENCOUNTER — Telehealth: Payer: Self-pay

## 2019-05-20 NOTE — Telephone Encounter (Signed)
Pt called and reports that she has noticed increased swelling bilaterally in lower extremities that started yesterday. Pt reports BP this morning of 130/89, she denies HA, blurry vision, or abdominal pain. Pt reports that she has a "sore spot" behind her knee on one side. Pt denies SOB. I advised pt to go to hospital for evaluation, pt voices understanding.

## 2019-06-01 ENCOUNTER — Inpatient Hospital Stay (HOSPITAL_COMMUNITY): Admission: AD | Admit: 2019-06-01 | Payer: Medicaid Other | Source: Home / Self Care

## 2019-06-15 ENCOUNTER — Encounter: Payer: Self-pay | Admitting: Certified Nurse Midwife

## 2019-06-15 ENCOUNTER — Telehealth (INDEPENDENT_AMBULATORY_CARE_PROVIDER_SITE_OTHER): Payer: Medicaid Other | Admitting: Certified Nurse Midwife

## 2019-06-15 NOTE — Progress Notes (Addendum)
POSTPARTUM VIRTUAL VISIT ENCOUNTER NOTE  Provider location: [facility] for [facility]   I connected with [name] on 06/15/19 at  2:29 PM EDT by MyChart Video Encounter at home and verified that I am speaking with the correct person using two identifiers.    I discussed the limitations, risks, security and privacy concerns of performing an evaluation and management service virtually and the availability of in person appointments. I also discussed with the patient that there may be a patient responsible charge related to this service. The patient expressed understanding and agreed to proceed.  Chief Complaint: Postpartum Visit  History of Present Illness:   Tricia Solomon is a 24 y.o. G58P1001 female being evaluated for postpartum followup.  She is 4 weeks postpartum following a normal spontaneous vaginal delivery at  37.4 gestational weeks.  I have fully reviewed the prenatal and intrapartum course; pregnancy complicated by gestational diabetes.  Postpartum course has been complicated by anxiety . Baby is doing well good- referred by pediatrician "food specialist/therapist" . Baby is feeding by bottle - Gerber Gentle. Bleeding staining only. Bowel function is normal. Bladder function is normal. Patient is not sexually active. Contraception method is none. Postpartum depression screening: negative.  The following portions of the patient's history were reviewed and updated as appropriate: allergies, current medications, past medical history and problem list.  Review of Systems: A comprehensive review of systems was negative.  Patient Active Problem List   Diagnosis Date Noted  . SVD (spontaneous vaginal delivery) 05/17/2019  . ROM (rupture of membranes), premature 05/14/2019  . Diet controlled gestational diabetes mellitus (GDM) in third trimester 03/07/2019  . Obesity in pregnancy 11/10/2018  . Supervision of normal first pregnancy, antepartum 10/13/2018  . Depression with  anxiety 05/08/2018    Medications Jacob Brearley had no medications administered during this visit. Current Outpatient Medications  Medication Sig Dispense Refill  . sertraline (ZOLOFT) 25 MG tablet Take 1 tablet (25 mg total) by mouth daily. 30 tablet 3  . ibuprofen (ADVIL) 600 MG tablet Take 1 tablet (600 mg total) by mouth every 6 (six) hours. (Patient not taking: Reported on 06/15/2019) 30 tablet 0  . Prenatal Vit-Fe Fumarate-FA (PRENATAL MULTIVITAMIN) TABS tablet Take 1 tablet by mouth daily at 12 noon.     No current facility-administered medications for this visit.    Allergies Patient has no known allergies.  Physical Exam:  LMP 08/25/2018   General:  Alert, oriented and cooperative. Patient is in no acute distress.  Mental Status: Normal mood and affect. Normal behavior. Normal judgment and thought content.   Respiratory: Normal respiratory effort noted, no problems with respiration noted  Rest of physical exam deferred due to type of encounter  PP Depression Screening:   Edinburgh Postnatal Depression Scale Screening Tool 06/15/2019 05/17/2019  I have been able to laugh and see the funny side of things. 0 0  I have looked forward with enjoyment to things. 0 0  I have blamed myself unnecessarily when things went wrong. 2 2  I have been anxious or worried for no good reason. 2 2  I have felt scared or panicky for no good reason. 0 0  Things have been getting on top of me. 1 2  I have been so unhappy that I have had difficulty sleeping. 0 0  I have felt sad or miserable. 1 1  I have been so unhappy that I have been crying. 1 1  The thought of harming myself has  occurred to me. 0 0  Edinburgh Postnatal Depression Scale Total 7 8     Assessment:Patient is a 24 y.o. G1P1001 who is 4 weeks postpartum from a normal spontaneous vaginal delivery.  She is doing well.   Plan: 1. Postpartum care and examination - normal postpartum care - Educated and discussed birth control  options in detail with patient, patient declines birth control at this time, discussed pregnancy spacing with patient and use of NFP.    Essential components of care per ACOG recommendations:  1.  Mood and well being: Patient with negative depression screening today. Reviewed local resources for support.  - Patient does not use tobacco.  - hx of drug use? No    2. Infant care and feeding:  -Patient currently breastmilk feeding? No  -Social determinants of health (SDOH) reviewed in EPIC. No concerns  3. Sexuality, contraception and birth spacing - Patient does not want a pregnancy in the next year.  Desired family size is unknown at this time.  - Reviewed forms of contraception in tiered fashion. Patient desired no method today.   - Discussed birth spacing of 18 months  4. Sleep and fatigue -Encouraged family/partner/community support of 4 hrs of uninterrupted sleep to help with mood and fatigue  5. Physical Recovery  - Discussed patients delivery and complications - Patient had a 2nd degree laceration, perineal healing reviewed. Patient expressed understanding - Patient has urinary incontinence? No - Patient is safe to resume physical and sexual activity  6.  Health Maintenance - Last pap smear done 11/09/2021 and was normal with negative HPV.   I discussed the assessment and treatment plan with the patient. The patient was provided an opportunity to ask questions and all were answered. The patient agreed with the plan and demonstrated an understanding of the instructions.   The patient was advised to call back or seek an in-person evaluation/go to the ED for any concerning postpartum symptoms.  I provided 10 minutes of face-to-face time during this encounter.   Lajean Manes, [place] for [employer], [place]

## 2019-06-17 ENCOUNTER — Ambulatory Visit: Payer: Medicaid Other

## 2019-07-06 ENCOUNTER — Other Ambulatory Visit: Payer: Self-pay

## 2019-07-07 ENCOUNTER — Encounter: Payer: Self-pay | Admitting: Family Medicine

## 2019-07-07 ENCOUNTER — Ambulatory Visit (INDEPENDENT_AMBULATORY_CARE_PROVIDER_SITE_OTHER): Payer: Medicaid Other | Admitting: Family Medicine

## 2019-07-07 VITALS — BP 116/74 | HR 93 | Temp 96.9°F | Ht 63.0 in | Wt 220.4 lb

## 2019-07-07 DIAGNOSIS — F418 Other specified anxiety disorders: Secondary | ICD-10-CM

## 2019-07-07 DIAGNOSIS — D649 Anemia, unspecified: Secondary | ICD-10-CM | POA: Insufficient documentation

## 2019-07-07 DIAGNOSIS — Z8349 Family history of other endocrine, nutritional and metabolic diseases: Secondary | ICD-10-CM

## 2019-07-07 DIAGNOSIS — Z8632 Personal history of gestational diabetes: Secondary | ICD-10-CM | POA: Insufficient documentation

## 2019-07-07 LAB — BASIC METABOLIC PANEL
BUN: 11 mg/dL (ref 6–23)
CO2: 27 mEq/L (ref 19–32)
Calcium: 8.5 mg/dL (ref 8.4–10.5)
Chloride: 105 mEq/L (ref 96–112)
Creatinine, Ser: 0.67 mg/dL (ref 0.40–1.20)
GFR: 110.03 mL/min (ref >60.00)
Glucose, Bld: 97 mg/dL (ref 70–99)
Potassium: 4.2 mEq/L (ref 3.5–5.1)
Sodium: 139 mEq/L (ref 135–145)

## 2019-07-07 LAB — CBC
HCT: 37.5 % (ref 36.0–46.0)
Hemoglobin: 12.3 g/dL (ref 12.0–15.0)
MCHC: 32.9 g/dL (ref 30.0–36.0)
MCV: 85.9 fl (ref 78.0–100.0)
Platelets: 277 10*3/uL (ref 150.0–400.0)
RBC: 4.37 Mil/uL (ref 3.87–5.11)
RDW: 15.3 % (ref 11.5–15.5)
WBC: 6.4 10*3/uL (ref 4.0–10.5)

## 2019-07-07 LAB — VITAMIN B12: Vitamin B-12: 225 pg/mL (ref 211–911)

## 2019-07-07 LAB — HEMOGLOBIN A1C: Hgb A1c MFr Bld: 5.2 % (ref 4.6–6.5)

## 2019-07-07 LAB — TSH: TSH: 0.86 u[IU]/mL (ref 0.35–4.50)

## 2019-07-07 NOTE — Progress Notes (Addendum)
Established Patient Office Visit  Subjective:  Patient ID: Tricia Solomon, female    DOB: 05-05-1995  Age: 24 y.o. MRN: [id number]  CC:  Chief Complaint  Patient presents with  . Fatigue    c/o being tired a lot patient would like thyroid panel checked    HPI Tricia Solomon presents for follow-up status post birth of her baby son back in first part of February.  She is doing well.  We will be restarting her Zoloft for her and she has been taking it for couple of months with good effect.  Baby has been diagnosed with hypothyroidism and she would like to be checked.  She has been dealing with lack of energy and cold intolerance.  Review of the blood work shows hemoglobin of 9.6 with a normal MCV.  She did have a history of gestational diabetes.  Glucose normalized status post parturition.  Past Medical History:  Diagnosis Date  . Depression   . Diabetes mellitus without complication (HCC)    GDM  . Diet controlled gestational diabetes mellitus (GDM) in third trimester 03/07/2019   Some elevated glucose values on 1/5 but pt taking some values at 1 hour, not 2.  Reeval in 2 weeks.  . Gestational diabetes   . Supervision of normal first pregnancy, antepartum 10/13/2018   BABYSCRIPTS PATIENT: [x ]Initial [x ]12 [ ] 20 [ ] 28 [ ] 32 [ ] 36 [ ] 38 [ ] 39 [ ] 40  Nursing Staff Provider Office Location  [facility]  Dating  LMP Language  English Anatomy  EIF, bilat CPC EFW 43%tile at 34 weeks (5lbs 2 oz) Flu Vaccine  03-02-19 Genetic Screen  NIPS: low risk  AFP:      TDaP vaccine  03-02-19 Hgb A1C or  GTT Early 5.0 Third trimester   Ref. Range 03/02/2019 08:58 Glucose, 1 hour Late    Past Surgical History:  Procedure Laterality Date  . NO PAST SURGERIES      Family History  Problem Relation Age of Onset  . Heart disease Maternal Grandmother   . Heart disease Paternal Grandfather     Social History   Socioeconomic History  . Marital status: Single    Spouse name: Not on file  . Number of children:  Not on file  . Years of education: Not on file  . Highest education level: Not on file  Occupational History  . Not on file  Tobacco Use  . Smoking status: Former  . Smokeless tobacco: Never Used  Substance and Sexual Activity  . Alcohol use: Not Currently  . Drug use: Not Currently    Types: Marijuana    Comment: last used prior to pregnancy  . Sexual activity: Yes  Other Topics Concern  . Not on file  Social History Narrative  . Not on file   Social Determinants of Health   Financial Resource Strain:   . Difficulty of Paying Living Expenses:   Food Insecurity:   . Worried About in the Last Year:   . in the Last Year:   Transportation Needs:   . [place] (Medical):   09[birth year]1974 Lack of Transportation (Non-Medical):   Physical Activity:   . Days of Exercise per Week:   . Minutes of Exercise per Session:   Stress:   . Feeling of Stress :   Social Connections:   . Frequency of Communication with Friends and Family:   . Frequency of Social Gatherings with Friends  and Family:   . Attends Religious Services:   . Active Member of Clubs or Organizations:   . Attends Banker Meetings:   Marland Kitchen Marital Status:   Intimate Partner Violence:   . Fear of Current or Ex-Partner:   . Emotionally Abused:   Marland Kitchen Physically Abused:   . Sexually Abused:     Outpatient Medications Prior to Visit  Medication Sig Dispense Refill  . sertraline (ZOLOFT) 25 MG tablet Take 1 tablet (25 mg total) by mouth daily. 30 tablet 3  . ibuprofen (ADVIL) 600 MG tablet Take 1 tablet (600 mg total) by mouth every 6 (six) hours. (Patient not taking: Reported on 06/15/2019) 30 tablet 0  . Prenatal Vit-Fe Fumarate-FA (PRENATAL MULTIVITAMIN) TABS tablet Take 1 tablet by mouth daily at 12 noon.     No facility-administered medications prior to visit.    No Known Allergies  ROS Review of Systems  Constitutional: Positive for fatigue.  HENT:  Negative.   Eyes: Negative for photophobia and visual disturbance.  Respiratory: Negative.   Cardiovascular: Negative.   Gastrointestinal: Negative.   Endocrine: Negative for polyphagia and polyuria.  Genitourinary: Negative.   Skin: Negative for pallor and rash.  Allergic/Immunologic: Negative for immunocompromised state.  Neurological: Negative for tremors and speech difficulty.  Hematological: Negative.   Psychiatric/Behavioral: Positive for dysphoric mood. Negative for self-injury. The patient is nervous/anxious.    Depression screen [facility] 2/9 07/07/2019 07/07/2019 03/24/2019  Decreased Interest 0 0 0  Down, Depressed, Hopeless 0 0 0  PHQ - 2 Score 0 0 0  Altered sleeping 1 - -  Tired, decreased energy 2 - -  Change in appetite 1 - -  Feeling bad or failure about yourself  0 - -  Trouble concentrating 0 - -  Moving slowly or fidgety/restless 0 - -  Suicidal thoughts 0 - -  PHQ-9 Score 4 - -  Difficult doing work/chores Somewhat difficult - -      Objective:    Physical Exam  Constitutional: She is oriented to person, place, and time. She appears well-developed and well-nourished. No distress.  HENT:  Head: Normocephalic and atraumatic.  Right Ear: External ear normal.  Left Ear: External ear normal.  Eyes: Conjunctivae are normal. Right eye exhibits no discharge. Left eye exhibits no discharge. No scleral icterus.  Neck: No JVD present. No tracheal deviation present. No thyromegaly present.  Cardiovascular: Normal rate, regular rhythm and normal heart sounds.  Pulmonary/Chest: Effort normal and breath sounds normal. No stridor.  Abdominal: Bowel sounds are normal.  Lymphadenopathy:    She has no cervical adenopathy.  Neurological: She is alert and oriented to person, place, and time.  Skin: Skin is warm and dry. She is not diaphoretic.  Psychiatric: She has a normal mood and affect. Her behavior is normal.    BP 116/74   Pulse 93   Temp (!) 96.9 F (36.1 C)  (Tympanic)   Ht 5\' 3"  (1.6 m)   Wt 220 lb 6.4 oz (100 kg)   LMP 08/25/2018   SpO2 97%   BMI 39.04 kg/m  Wt Readings from Last 3 Encounters:  07/07/19 220 lb 6.4 oz (100 kg)  05/14/19 224 lb (101.6 kg)  05/07/19 223 lb 3.2 oz (101.2 kg)     Health Maintenance Due  Topic Date Due  . URINE MICROALBUMIN  Never done    There are no preventive care reminders to display for this patient.  No results found for: TSH Lab Results  Component Value Date   WBC 10.0 05/16/2019   HGB 9.6 (L) 05/16/2019   HCT 29.6 (L) 05/16/2019   MCV 94.3 05/16/2019   PLT 167 05/16/2019   Lab Results  Component Value Date   NA 138 05/08/2018   K 4.0 05/08/2018   CO2 24 05/08/2018   GLUCOSE 78 05/08/2018   BUN 9 05/08/2018   CREATININE 0.66 05/08/2018   BILITOT 1.0 05/08/2018   AST 11 05/08/2018   ALT 13 05/08/2018   PROT 6.9 05/08/2018   CALCIUM 9.0 05/08/2018   Lab Results  Component Value Date   CHOL 263 (H) 05/08/2018   Lab Results  Component Value Date   HDL 55 05/08/2018   Lab Results  Component Value Date   LDLCALC 177 (H) 05/08/2018   Lab Results  Component Value Date   TRIG 160 (H) 05/08/2018   Lab Results  Component Value Date   CHOLHDL 4.8 05/08/2018   Lab Results  Component Value Date   HGBA1C 5.0 11/10/2018      Assessment & Plan:   Problem List Items Addressed This Visit      Other   Depression with anxiety   Family history of hypothyroidism   Relevant Orders   TSH   History of gestational diabetes - Primary   Relevant Orders   Hemoglobin Q3R   Basic metabolic panel   Anemia   Relevant Orders   CBC   Iron, TIBC and Ferritin Panel   Vitamin B12      No orders of the defined types were placed in this encounter.   Follow-up: Return in about 3 months (around 10/06/2019).   Information was given to the patient and hypothyroidism.  We briefly discussed her elevated LDL cholesterol.   Libby Maw, MD

## 2019-07-07 NOTE — Patient Instructions (Signed)
Hypothyroidism  Hypothyroidism is when the thyroid gland does not make enough of certain hormones (it is underactive). The thyroid gland is a small gland located in the lower front part of the neck, just in front of the windpipe (trachea). This gland makes hormones that help control how the body uses food for energy (metabolism) as well as how the heart and brain function. These hormones also play a role in keeping your bones strong. When the thyroid is underactive, it produces too little of the hormones thyroxine (T4) and triiodothyronine (T3). What are the causes? This condition may be caused by:  Hashimoto's disease. This is a disease in which the body's disease-fighting system (immune system) attacks the thyroid gland. This is the most common cause.  Viral infections.  Pregnancy.  Certain medicines.  Birth defects.  Past radiation treatments to the head or neck for cancer.  Past treatment with radioactive iodine.  Past exposure to radiation in the environment.  Past surgical removal of part or all of the thyroid.  Problems with a gland in the center of the brain (pituitary gland).  Lack of enough iodine in the diet. What increases the risk? You are more likely to develop this condition if:  You are female.  You have a family history of thyroid conditions.  You use a medicine called lithium.  You take medicines that affect the immune system (immunosuppressants). What are the signs or symptoms? Symptoms of this condition include:  Feeling as though you have no energy (lethargy).  Not being able to tolerate cold.  Weight gain that is not explained by a change in diet or exercise habits.  Lack of appetite.  Dry skin.  Coarse hair.  Menstrual irregularity.  Slowing of thought processes.  Constipation.  Sadness or depression. How is this diagnosed? This condition may be diagnosed based on:  Your symptoms, your medical history, and a physical exam.  Blood  tests. You may also have imaging tests, such as an ultrasound or MRI. How is this treated? This condition is treated with medicine that replaces the thyroid hormones that your body does not make. After you begin treatment, it may take several weeks for symptoms to go away. Follow these instructions at home:  Take over-the-counter and prescription medicines only as told by your health care provider.  If you start taking any new medicines, tell your health care provider.  Keep all follow-up visits as told by your health care provider. This is important. ? As your condition improves, your dosage of thyroid hormone medicine may change. ? You will need to have blood tests regularly so that your health care provider can monitor your condition. Contact a health care provider if:  Your symptoms do not get better with treatment.  You are taking thyroid replacement medicine and you: ? Sweat a lot. ? Have tremors. ? Feel anxious. ? Lose weight rapidly. ? Cannot tolerate heat. ? Have emotional swings. ? Have diarrhea. ? Feel weak. Get help right away if you have:  Chest pain.  An irregular heartbeat.  A rapid heartbeat.  Difficulty breathing. Summary  Hypothyroidism is when the thyroid gland does not make enough of certain hormones (it is underactive).  When the thyroid is underactive, it produces too little of the hormones thyroxine (T4) and triiodothyronine (T3).  The most common cause is Hashimoto's disease, a disease in which the body's disease-fighting system (immune system) attacks the thyroid gland. The condition can also be caused by viral infections, medicine, pregnancy, or past   radiation treatment to the head or neck.  Symptoms may include weight gain, dry skin, constipation, feeling as though you do not have energy, and not being able to tolerate cold.  This condition is treated with medicine to replace the thyroid hormones that your body does not make. This information  is not intended to replace advice given to you by your health care provider. Make sure you discuss any questions you have with your health care provider. Document Revised: 02/21/2017 Document Reviewed: 02/19/2017 Elsevier Patient Education  2020 Elsevier Inc.  

## 2019-07-08 LAB — IRON,TIBC AND FERRITIN PANEL
%SAT: 9 % (calc) — ABNORMAL LOW (ref 16–45)
Ferritin: 13 ng/mL — ABNORMAL LOW (ref 16–154)
Iron: 33 ug/dL — ABNORMAL LOW (ref 40–190)
TIBC: 352 mcg/dL (calc) (ref 250–450)

## 2019-07-22 DIAGNOSIS — Z23 Encounter for immunization: Secondary | ICD-10-CM | POA: Diagnosis not present

## 2019-08-12 DIAGNOSIS — Z23 Encounter for immunization: Secondary | ICD-10-CM | POA: Diagnosis not present

## 2019-08-13 ENCOUNTER — Telehealth: Payer: Self-pay | Admitting: Family Medicine

## 2019-08-13 NOTE — Telephone Encounter (Signed)
Patient is calling and wanted to see if someone could go over lab results.

## 2019-08-13 NOTE — Telephone Encounter (Signed)
Spoke with patient went over labs with her and advised to take iron supplements daily. No concerns

## 2019-09-13 ENCOUNTER — Other Ambulatory Visit: Payer: Self-pay | Admitting: Obstetrics and Gynecology

## 2019-09-20 ENCOUNTER — Other Ambulatory Visit: Payer: Self-pay | Admitting: Obstetrics and Gynecology

## 2019-09-20 MED ORDER — SERTRALINE HCL 25 MG PO TABS: 25.0000 mg | ORAL_TABLET | Freq: Every day | ORAL | 5 refills | Status: DC

## 2019-10-06 ENCOUNTER — Ambulatory Visit: Payer: Medicaid Other | Admitting: Family Medicine

## 2019-10-11 ENCOUNTER — Other Ambulatory Visit: Payer: Self-pay

## 2019-10-12 ENCOUNTER — Encounter: Payer: Self-pay | Admitting: Family Medicine

## 2019-10-12 ENCOUNTER — Ambulatory Visit: Payer: Medicaid Other | Admitting: Family Medicine

## 2019-10-12 VITALS — BP 108/70 | HR 99 | Temp 97.7°F | Ht 63.0 in | Wt 226.4 lb

## 2019-10-12 DIAGNOSIS — F418 Other specified anxiety disorders: Secondary | ICD-10-CM | POA: Diagnosis not present

## 2019-10-12 DIAGNOSIS — S29019A Strain of muscle and tendon of unspecified wall of thorax, initial encounter: Secondary | ICD-10-CM | POA: Diagnosis not present

## 2019-10-12 DIAGNOSIS — E611 Iron deficiency: Secondary | ICD-10-CM | POA: Diagnosis not present

## 2019-10-12 DIAGNOSIS — D649 Anemia, unspecified: Secondary | ICD-10-CM | POA: Diagnosis not present

## 2019-10-12 DIAGNOSIS — K649 Unspecified hemorrhoids: Secondary | ICD-10-CM | POA: Diagnosis not present

## 2019-10-12 LAB — URINALYSIS, ROUTINE W REFLEX MICROSCOPIC
Bilirubin Urine: NEGATIVE
Hgb urine dipstick: NEGATIVE
Ketones, ur: NEGATIVE
Nitrite: NEGATIVE
RBC / HPF: NONE SEEN (ref 0–?)
Specific Gravity, Urine: 1.025 (ref 1.000–1.030)
Total Protein, Urine: NEGATIVE
Urine Glucose: NEGATIVE
Urobilinogen, UA: 0.2 (ref 0.0–1.0)
pH: 6 (ref 5.0–8.0)

## 2019-10-12 LAB — BASIC METABOLIC PANEL
BUN: 9 mg/dL (ref 6–23)
CO2: 26 mEq/L (ref 19–32)
Calcium: 8.6 mg/dL (ref 8.4–10.5)
Chloride: 106 mEq/L (ref 96–112)
Creatinine, Ser: 0.67 mg/dL (ref 0.40–1.20)
GFR: 109.76 mL/min (ref >60.00)
Glucose, Bld: 98 mg/dL (ref 70–99)
Potassium: 4.2 mEq/L (ref 3.5–5.1)
Sodium: 138 mEq/L (ref 135–145)

## 2019-10-12 LAB — CBC
HCT: 40.1 % (ref 36.0–46.0)
Hemoglobin: 13.3 g/dL (ref 12.0–15.0)
MCHC: 33.2 g/dL (ref 30.0–36.0)
MCV: 87.4 fl (ref 78.0–100.0)
Platelets: 240 10*3/uL (ref 150.0–400.0)
RBC: 4.59 Mil/uL (ref 3.87–5.11)
RDW: 17.8 % — ABNORMAL HIGH (ref 11.5–15.5)
WBC: 7.7 10*3/uL (ref 4.0–10.5)

## 2019-10-12 LAB — B12 AND FOLATE PANEL
Folate: 22.3 ng/mL (ref >5.9)
Vitamin B-12: 259 pg/mL (ref 211–911)

## 2019-10-12 MED ORDER — HYDROCORTISONE (PERIANAL) 2.5 % EX CREA: 1.0000 "application " | TOPICAL_CREAM | Freq: Two times a day (BID) | CUTANEOUS | 2 refills | Status: DC

## 2019-10-12 MED ORDER — SERTRALINE HCL 50 MG PO TABS: 50.0000 mg | ORAL_TABLET | Freq: Every day | ORAL | 3 refills | Status: DC

## 2019-10-12 NOTE — Progress Notes (Signed)
Established Patient Office Visit  Subjective:  Patient ID: Tricia Solomon, female    DOB: [birth year]  Age: 24 y.o. MRN: [id number]  CC:  Chief Complaint  Patient presents with  . Follow-up    3 month follow, patient states that she feels pain in mid back on both sides.  . Hemorrhoids    , have noticed lots of blood after each BM.     HPI [name] presents for follow-up of multiple issues.  She has been having bilateral flank pain.  Family history of kidney stones in both parents.  She denies any radiation of pain around her abdomen into her groin area.  Denies hematuria, burning with urination or increased frequency.  She does have a 43-month old baby boy at home who is doing quite well.  Baby is sleeping through the night.  She feels a little less energetic and her significant other has commented on her sadness.  She feels a little flat emotionally.  Continues to take iron for iron deficiency anemia.  She is constipated with this.  Feels as though the hemorrhoid she had developed in pregnancy had returned.  There is blood in her stool.  She is taking Colace once daily.  Past Medical History:  Diagnosis Date  . Depression   . Diabetes mellitus without complication (HCC)    GDM  . Diet controlled gestational diabetes mellitus (GDM) in third trimester 03/07/2019   Some elevated glucose values on 1/5 but pt taking some values at 1 hour, not 2.  Reeval in 2 weeks.  . Gestational diabetes   . Supervision of normal first pregnancy, antepartum 10/13/2018   BABYSCRIPTS PATIENT: [x ]Initial [x ]12 [ ] 20 [ ] 28 [ ] 32 [ ] 36 [ ] 38 [ ] 39 [ ] 40  Nursing Staff Provider Office Location  [place]  Dating  LMP Language  English Anatomy  EIF, bilat [facility]PC EFW 43%tile at 34 weeks (5lbs 2 oz) Flu Vaccine  03-02-19 Genetic Screen  NIPS: low risk  AFP:      TDaP vaccine  03-02-19 Hgb A1C or  GTT Early 5.0 Third trimester   Ref. Range 03/02/2019 08:58 Glucose, 1 hour Late    Past Surgical History:  Procedure  Laterality Date  . NO PAST SURGERIES      Family History  Problem Relation Age of Onset  . Heart disease Maternal Grandmother   . Heart disease Paternal Grandfather     Social History   Socioeconomic History  . Marital status: Single    Spouse name: Not on file  . Number of children: Not on file  . Years of education: Not on file  . Highest education level: Not on file  Occupational History  . Not on file  Tobacco Use  . Smoking status: Former  . Smokeless tobacco: Never Used  Vaping Use  . Vaping Use: Never used  Substance and Sexual Activity  . Alcohol use: Not Currently  . Drug use: Not Currently    Types: Marijuana    Comment: last used prior to pregnancy  . Sexual activity: Yes  Other Topics Concern  . Not on file  Social History Narrative  . Not on file   Social Determinants of Health   Financial Resource Strain:   . Difficulty of Paying Living Expenses:   Food Insecurity:   . Worried About in the Last Year:   . in the Last Year:   Transportation Needs:   .  Lack of Transportation (Medical):   Marland Kitchen. Lack of Transportation (Non-Medical):   Physical Activity:   . Days of Exercise per Week:   . Minutes of Exercise per Session:   Stress:   . Feeling of Stress :   Social Connections:   . Frequency of Communication with Friends and Family:   . Frequency of Social Gatherings with Friends and Family:   . Attends Religious Services:   . Active Member of Clubs or Organizations:   . Attends BankerClub or Organization Meetings:   Marland Kitchen. Marital Status:   Intimate Partner Violence:   . Fear of Current or Ex-Partner:   . Emotionally Abused:   Marland Kitchen. Physically Abused:   . Sexually Abused:     Outpatient Medications Prior to Visit  Medication Sig Dispense Refill  . Prenatal Vit-Fe Fumarate-FA (PRENATAL MULTIVITAMIN) TABS tablet Take 1 tablet by mouth daily at 12 noon.    . sertraline (ZOLOFT) 25 MG tablet Take 1 tablet (25 mg total) by  mouth daily. 30 tablet 5  . ibuprofen (ADVIL) 600 MG tablet Take 1 tablet (600 mg total) by mouth every 6 (six) hours. (Patient not taking: Reported on 06/15/2019) 30 tablet 0   No facility-administered medications prior to visit.    No Known Allergies  ROS Review of Systems  Constitutional: Positive for fatigue.  HENT: Negative.   Eyes: Negative for photophobia and visual disturbance.  Respiratory: Negative.   Cardiovascular: Negative.   Gastrointestinal: Positive for blood in stool and constipation. Negative for abdominal pain.  Endocrine: Negative for polyphagia and polyuria.  Genitourinary: Negative for dysuria, frequency, hematuria and urgency.  Musculoskeletal: Positive for back pain. Negative for myalgias.  Skin: Negative for pallor and rash.  Allergic/Immunologic: Negative for immunocompromised state.  Neurological: Negative for weakness and numbness.  Hematological: Does not bruise/bleed easily.       [facility] 10/12/2019 07/07/2019 07/07/2019  Decreased Interest 1 0 0  Down, Depressed, Hopeless 1 0 0  PHQ - 2 Score 2 0 0  Altered sleeping 2 1 -  Tired, decreased energy 2 2 -  Change in appetite 1 1 -  Feeling bad or failure about yourself  0 0 -  Trouble concentrating 0 0 -  Moving slowly or fidgety/restless 1 0 -  Suicidal thoughts 0 0 -  PHQ-9 Score 8 4 -  Difficult doing work/chores Somewhat difficult Somewhat difficult -    Objective:    Physical Exam Constitutional:      General: She is not in acute distress.    Appearance: Normal appearance. She is not ill-appearing, toxic-appearing or diaphoretic.  HENT:     Head: Normocephalic and atraumatic.     Right Ear: External ear normal.     Left Ear: External ear normal.  Eyes:     General: No scleral icterus.       Right eye: No discharge.        Left eye: No discharge.     Extraocular Movements: Extraocular movements intact.     Conjunctiva/sclera: Conjunctivae normal.     Pupils: Pupils  are equal, round, and reactive to light.  Cardiovascular:     Rate and Rhythm: Normal rate and regular rhythm.  Pulmonary:     Effort: Pulmonary effort is normal.  Abdominal:     General: Bowel sounds are normal.  Genitourinary:    Rectum: Guaiac result negative. External hemorrhoid present. No mass, tenderness, anal fissure or internal hemorrhoid. Normal anal tone.  Musculoskeletal:  Thoracic back: No swelling, spasms, tenderness or bony tenderness. Normal range of motion.  Skin:    General: Skin is warm and dry.  Neurological:     Mental Status: She is alert.  Psychiatric:        Mood and Affect: Mood normal.        Behavior: Behavior normal.     BP 108/70   Pulse 99   Temp 97.7 F (36.5 C) (Tympanic)   Ht 5\' 3"  (1.6 m)   Wt 226 lb 6.4 oz (102.7 kg)   SpO2 97%   BMI 40.10 kg/m  Wt Readings from Last 3 Encounters:  10/12/19 226 lb 6.4 oz (102.7 kg)  07/07/19 220 lb 6.4 oz (100 kg)  05/14/19 224 lb (101.6 kg)     Health Maintenance Due  Topic Date Due  . Hepatitis C Screening  Never done  . URINE MICROALBUMIN  Never done  . COVID-19 Vaccine (1) Never done    There are no preventive care reminders to display for this patient.  Lab Results  Component Value Date   TSH 0.86 07/07/2019   Lab Results  Component Value Date   WBC 6.4 07/07/2019   HGB 12.3 07/07/2019   HCT 37.5 07/07/2019   MCV 85.9 07/07/2019   PLT 277.0 07/07/2019   Lab Results  Component Value Date   NA 139 07/07/2019   K 4.2 07/07/2019   CO2 27 07/07/2019   GLUCOSE 97 07/07/2019   BUN 11 07/07/2019   CREATININE 0.67 07/07/2019   BILITOT 1.0 05/08/2018   AST 11 05/08/2018   ALT 13 05/08/2018   PROT 6.9 05/08/2018   CALCIUM 8.5 07/07/2019   GFR 110.03 07/07/2019   Lab Results  Component Value Date   CHOL 263 (H) 05/08/2018   Lab Results  Component Value Date   HDL 55 05/08/2018   Lab Results  Component Value Date   LDLCALC 177 (H) 05/08/2018   Lab Results  Component  Value Date   TRIG 160 (H) 05/08/2018   Lab Results  Component Value Date   CHOLHDL 4.8 05/08/2018   Lab Results  Component Value Date   HGBA1C 5.2 07/07/2019      Assessment & Plan:   Problem List Items Addressed This Visit      Cardiovascular and Mediastinum   Hemorrhoids   Relevant Medications   hydrocortisone (ANUSOL-HC) 2.5 % rectal cream     Musculoskeletal and Integument   Thoracic myofascial strain   Relevant Orders   Urinalysis, Routine w reflex microscopic     Other   Depression with anxiety   Relevant Medications   sertraline (ZOLOFT) 50 MG tablet   Anemia - Primary   Relevant Orders   Basic metabolic panel   CBC   Urinalysis, Routine w reflex microscopic   B12 and Folate Panel   TSH   Iron deficiency   Relevant Orders   CBC   Iron, TIBC and Ferritin Panel      Meds ordered this encounter  Medications  . sertraline (ZOLOFT) 50 MG tablet    Sig: Take 1 tablet (50 mg total) by mouth daily.    Dispense:  30 tablet    Refill:  3  . hydrocortisone (ANUSOL-HC) 2.5 % rectal cream    Sig: Place 1 application rectally 2 (two) times daily.    Dispense:  30 g    Refill:  2    Follow-up: No follow-ups on file.  Increase Colace to twice daily.  May  be able to decrease iron dosage.  Will increase Zoloft to 50 mg daily.   Mliss Sax, MD

## 2019-10-13 ENCOUNTER — Telehealth: Payer: Self-pay | Admitting: Family Medicine

## 2019-10-13 LAB — IRON,TIBC AND FERRITIN PANEL
Ferritin: 40 ng/mL (ref 16–154)
Iron: 123 ug/dL (ref 40–190)

## 2019-10-13 NOTE — Telephone Encounter (Signed)
Patient states blood work and urine results posted to her mychart last night. She would like the nurse to call and explain results to her.

## 2019-12-09 DIAGNOSIS — F4323 Adjustment disorder with mixed anxiety and depressed mood: Secondary | ICD-10-CM | POA: Diagnosis not present

## 2019-12-16 DIAGNOSIS — F4323 Adjustment disorder with mixed anxiety and depressed mood: Secondary | ICD-10-CM | POA: Diagnosis not present

## 2019-12-30 DIAGNOSIS — F4323 Adjustment disorder with mixed anxiety and depressed mood: Secondary | ICD-10-CM | POA: Diagnosis not present

## 2020-01-06 DIAGNOSIS — F4323 Adjustment disorder with mixed anxiety and depressed mood: Secondary | ICD-10-CM | POA: Diagnosis not present

## 2020-01-10 ENCOUNTER — Ambulatory Visit: Payer: Medicaid Other | Admitting: Family Medicine

## 2020-01-10 ENCOUNTER — Other Ambulatory Visit: Payer: Self-pay

## 2020-01-10 ENCOUNTER — Encounter: Payer: Self-pay | Admitting: Family Medicine

## 2020-01-10 VITALS — BP 100/76 | HR 97 | Temp 97.9°F | Ht 63.0 in | Wt 222.2 lb

## 2020-01-10 DIAGNOSIS — F418 Other specified anxiety disorders: Secondary | ICD-10-CM | POA: Diagnosis not present

## 2020-01-10 MED ORDER — VENLAFAXINE HCL ER 75 MG PO CP24: 75.0000 mg | ORAL_CAPSULE | Freq: Every day | ORAL | 1 refills | Status: DC

## 2020-01-10 MED ORDER — VENLAFAXINE HCL ER 75 MG PO CP24: ORAL_CAPSULE | ORAL | 1 refills | Status: DC

## 2020-01-10 NOTE — Progress Notes (Signed)
Established Patient Office Visit  Subjective:  Patient ID: Tricia Solomon, female    DOB: Jul 23, 1995  Age: 24 y.o. MRN: [id number]  CC:  Chief Complaint  Patient presents with   Follow-up    follow up on medication, patient stopped taking Zoloft 2 weeks ago and would like to try something else to have on hand and use as needed for symptoms.     HPI Tricia Solomon presents for follow-up of depression.  Patient discontinued Zoloft a few weeks ago because it was making her feel groggy.  Denies any real withdrawal side effects.  She had taken Paxil in the past and experienced weight gain.  Fluoxetine did not agree with her.  She would like to try something new.  Her 51-month-old is doing well.  She has not been breast-feeding.  She is thinking about returning to work and would like to try different medication for her depression.  Past Medical History:  Diagnosis Date   Depression    Diabetes mellitus without complication (HCC)    GDM   Diet controlled gestational diabetes mellitus (GDM) in third trimester 03/07/2019   Some elevated glucose values on 1/5 but pt taking some values at 1 hour, not 2.  Reeval in 2 weeks.   Gestational diabetes    Supervision of normal first pregnancy, antepartum 10/13/2018   BABYSCRIPTS PATIENT: [x ]Initial [x ]12 [ ] 20 [ ] 28 [ ] 32 [ ] 36 [ ] 38 [ ] 39 [ ] 40  Nursing Staff Provider Office Location  [place]  Dating  LMP Language  English Anatomy  EIF, bilat [facility] EFW 43%tile at 34 weeks (5lbs 2 oz) Flu Vaccine  03-02-19 Genetic Screen  NIPS: low risk  AFP:      TDaP vaccine  03-02-19 Hgb A1C or  GTT Early 5.0 Third trimester   Ref. Range 03/02/2019 08:58 Glucose, 1 hour Late    Past Surgical History:  Procedure Laterality Date   NO PAST SURGERIES      Family History  Problem Relation Age of Onset   Heart disease Maternal Grandmother    Heart disease Paternal Grandfather     Social History   Socioeconomic History   Marital status: Single    Spouse name:  Not on file   Number of children: Not on file   Years of education: Not on file   Highest education level: Not on file  Occupational History   Not on file  Tobacco Use   Smoking status: Former Smoker   Smokeless tobacco: Never Used  Use: Never used  Substance and Sexual Activity   Alcohol use: Not Currently   Drug use: Not Currently    Types: Marijuana    Comment: last used prior to pregnancy   Sexual activity: Yes  Other Topics Concern   Not on file  Social History Narrative   Not on file   Social Determinants of Health   Financial Resource Strain:    Difficulty of Paying Living Expenses: Not on file  Food Insecurity:    Worried About in the Last Year: Not on file   of Food in the Last Year: Not on file  Transportation Needs:    Lack of Transportation (Medical): Not on file   Lack of Transportation (Non-Medical): Not on file  Physical Activity:    Days of Exercise per Week: Not on file   Minutes of Exercise per Session: Not on file  Stress:  Feeling of Stress : Not on file  Social Connections:    Frequency of Communication with Friends and Family: Not on file   Frequency of Social Gatherings with Friends and Family: Not on file   Attends Religious Services: Not on file   Active Member of Clubs or Organizations: Not on file   Attends Banker Meetings: Not on file   Marital Status: Not on file  Intimate Partner Violence:    Fear of Current or Ex-Partner: Not on file   Emotionally Abused: Not on file   Physically Abused: Not on file   Sexually Abused: Not on file    Outpatient Medications Prior to Visit  Medication Sig Dispense Refill   hydrocortisone (ANUSOL-HC) 2.5 % rectal cream Place 1 application rectally 2 (two) times daily. 30 g 2   Prenatal Vit-Fe Fumarate-FA (PRENATAL MULTIVITAMIN) TABS tablet Take 1 tablet by mouth daily at 12 noon.     sertraline (ZOLOFT) 50  MG tablet Take 1 tablet (50 mg total) by mouth daily. 30 tablet 3   ibuprofen (ADVIL) 600 MG tablet Take 1 tablet (600 mg total) by mouth every 6 (six) hours. (Patient not taking: Reported on 06/15/2019) 30 tablet 0   No facility-administered medications prior to visit.    No Known Allergies  ROS Review of Systems  Constitutional: Negative.   Respiratory: Negative.   Cardiovascular: Negative.   Gastrointestinal: Negative.    Depression screen [facility] 2/9 01/10/2020 10/12/2019 07/07/2019  Decreased Interest 2 1 0  Down, Depressed, Hopeless 1 1 0  PHQ - 2 Score 3 2 0  Altered sleeping 1 2 1   Tired, decreased energy 1 2 2   Change in appetite 2 1 1   Feeling bad or failure about yourself  1 0 0  Trouble concentrating 0 0 0  Moving slowly or fidgety/restless 0 1 0  Suicidal thoughts 1 0 0  PHQ-9 Score 9 8 4   Difficult doing work/chores Not difficult at all Somewhat difficult Somewhat difficult      Objective:    Physical Exam Vitals and nursing note reviewed.  Constitutional:      Appearance: Normal appearance.  HENT:     Right Ear: External ear normal.     Left Ear: External ear normal.  Eyes:     General: No scleral icterus.       Left eye: No discharge.     Conjunctiva/sclera: Conjunctivae normal.  Pulmonary:     Effort: Pulmonary effort is normal.  Neurological:     Mental Status: She is alert and oriented to person, place, and time.  Psychiatric:        Mood and Affect: Mood normal.        Behavior: Behavior normal.     BP 100/76    Pulse 97    Temp 97.9 F (36.6 C) (Tympanic)    Ht 5\' 3"  (1.6 m)    Wt 222 lb 3.2 oz (100.8 kg)    SpO2 97%    BMI 39.36 kg/m  Wt Readings from Last 3 Encounters:  01/10/20 222 lb 3.2 oz (100.8 kg)  10/12/19 226 lb 6.4 oz (102.7 kg)  07/07/19 220 lb 6.4 oz (100 kg)     Health Maintenance Due  Topic Date Due   Hepatitis C Screening  Never done   URINE MICROALBUMIN  Never done   COVID-19 Vaccine (1) Never done   INFLUENZA  VACCINE  10/24/2019    There are no preventive care reminders to display for  this patient.  Lab Results  Component Value Date   TSH 0.86 07/07/2019   Lab Results  Component Value Date   WBC 7.7 10/12/2019   HGB 13.3 10/12/2019   HCT 40.1 10/12/2019   MCV 87.4 10/12/2019   PLT 240.0 10/12/2019   Lab Results  Component Value Date   NA 138 10/12/2019   K 4.2 10/12/2019   CO2 26 10/12/2019   GLUCOSE 98 10/12/2019   BUN 9 10/12/2019   CREATININE 0.67 10/12/2019   BILITOT 1.0 05/08/2018   AST 11 05/08/2018   ALT 13 05/08/2018   PROT 6.9 05/08/2018   CALCIUM 8.6 10/12/2019   GFR 109.76 10/12/2019   Lab Results  Component Value Date   CHOL 263 (H) 05/08/2018   Lab Results  Component Value Date   HDL 55 05/08/2018   Lab Results  Component Value Date   LDLCALC 177 (H) 05/08/2018   Lab Results  Component Value Date   TRIG 160 (H) 05/08/2018   Lab Results  Component Value Date   CHOLHDL 4.8 05/08/2018   Lab Results  Component Value Date   HGBA1C 5.2 07/07/2019      Assessment & Plan:   Problem List Items Addressed This Visit      Other   Depression with anxiety - Primary   Relevant Medications   venlafaxine XR (EFFEXOR XR) 75 MG 24 hr capsule      Meds ordered this encounter  Medications   DISCONTD: venlafaxine XR (EFFEXOR XR) 75 MG 24 hr capsule    Sig: Take 1 capsule (75 mg total) by mouth daily with breakfast.    Dispense:  60 capsule    Refill:  1   venlafaxine XR (EFFEXOR XR) 75 MG 24 hr capsule    Sig: Take one daily with breakfast for one week and then increase to one twice daily.    Dispense:  60 capsule    Refill:  1    Follow-up: Return in about 4 weeks (around 02/07/2020).   She will start Effexor each morning.  Attempt to increase the dosage to 1 twice daily as tolerated.  Discussed changing time of day medication is taking, according to her side effect profile. Mliss Sax, MD

## 2020-01-10 NOTE — Patient Instructions (Signed)
Venlafaxine extended-release capsules What is this medicine? VENLAFAXINE(VEN la fax een) is used to treat depression, anxiety and panic disorder. This medicine may be used for other purposes; ask your health care provider or pharmacist if you have questions. COMMON BRAND NAME(S): Effexor XR What should I tell my health care provider before I take this medicine? They need to know if you have any of these conditions:  bleeding disorders  glaucoma  heart disease  high blood pressure  high cholesterol  kidney disease  liver disease  low levels of sodium in the blood  mania or bipolar disorder  seizures  suicidal thoughts, plans, or attempt; a previous suicide attempt by you or a family  take medicines that treat or prevent blood clots  thyroid disease  an unusual or allergic reaction to venlafaxine, desvenlafaxine, other medicines, foods, dyes, or preservatives  pregnant or trying to get pregnant  breast-feeding How should I use this medicine? Take this medicine by mouth with a full glass of water. Follow the directions on the prescription label. Do not cut, crush, or chew this medicine. Take it with food. If needed, the capsule may be carefully opened and the entire contents sprinkled on a spoonful of cool applesauce. Swallow the applesauce/pellet mixture right away without chewing and follow with a glass of water to ensure complete swallowing of the pellets. Try to take your medicine at about the same time each day. Do not take your medicine more often than directed. Do not stop taking this medicine suddenly except upon the advice of your doctor. Stopping this medicine too quickly may cause serious side effects or your condition may worsen. A special MedGuide will be given to you by the pharmacist with each prescription and refill. Be sure to read this information carefully each time. Talk to your pediatrician regarding the use of this medicine in children. Special care may be  needed. Overdosage: If you think you have taken too much of this medicine contact a poison control center or emergency room at once. NOTE: This medicine is only for you. Do not share this medicine with others. What if I miss a dose? If you miss a dose, take it as soon as you can. If it is almost time for your next dose, take only that dose. Do not take double or extra doses. What may interact with this medicine? Do not take this medicine with any of the following medications:  certain medicines for fungal infections like fluconazole, itraconazole, ketoconazole, posaconazole, voriconazole  cisapride  desvenlafaxine  dronedarone  duloxetine  levomilnacipran  linezolid  MAOIs like Carbex, Eldepryl, Marplan, Nardil, and Parnate  methylene blue (injected into a vein)  milnacipran  pimozide  thioridazine This medicine may also interact with the following medications:  amphetamines  aspirin and aspirin-like medicines  certain medicines for depression, anxiety, or psychotic disturbances  certain medicines for migraine headaches like almotriptan, eletriptan, frovatriptan, naratriptan, rizatriptan, sumatriptan, zolmitriptan  certain medicines for sleep  certain medicines that treat or prevent blood clots like dalteparin, enoxaparin, warfarin  cimetidine  clozapine  diuretics  fentanyl  furazolidone  indinavir  isoniazid  lithium  metoprolol  NSAIDS, medicines for pain and inflammation, like ibuprofen or naproxen  other medicines that prolong the QT interval (cause an abnormal heart rhythm) like dofetilide, ziprasidone  procarbazine  rasagiline  supplements like St. John's wort, kava kava, valerian  tramadol  tryptophan This list may not describe all possible interactions. Give your health care provider a list of all the medicines,   herbs, non-prescription drugs, or dietary supplements you use. Also tell them if you smoke, drink alcohol, or use illegal  drugs. Some items may interact with your medicine. What should I watch for while using this medicine? Tell your doctor if your symptoms do not get better or if they get worse. Visit your doctor or health care professional for regular checks on your progress. Because it may take several weeks to see the full effects of this medicine, it is important to continue your treatment as prescribed by your doctor. Patients and their families should watch out for new or worsening thoughts of suicide or depression. Also watch out for sudden changes in feelings such as feeling anxious, agitated, panicky, irritable, hostile, aggressive, impulsive, severely restless, overly excited and hyperactive, or not being able to sleep. If this happens, especially at the beginning of treatment or after a change in dose, call your health care professional. This medicine can cause an increase in blood pressure. Check with your doctor for instructions on monitoring your blood pressure while taking this medicine. You may get drowsy or dizzy. Do not drive, use machinery, or do anything that needs mental alertness until you know how this medicine affects you. Do not stand or sit up quickly, especially if you are an older patient. This reduces the risk of dizzy or fainting spells. Alcohol may interfere with the effect of this medicine. Avoid alcoholic drinks. Your mouth may get dry. Chewing sugarless gum, sucking hard candy and drinking plenty of water will help. Contact your doctor if the problem does not go away or is severe. What side effects may I notice from receiving this medicine? Side effects that you should report to your doctor or health care professional as soon as possible:  allergic reactions like skin rash, itching or hives, swelling of the face, lips, or tongue  anxious  breathing problems  confusion  changes in vision  chest pain  confusion  elevated mood, decreased need for sleep, racing thoughts, impulsive  behavior  eye pain  fast, irregular heartbeat  feeling faint or lightheaded, falls  feeling agitated, angry, or irritable  hallucination, loss of contact with reality  high blood pressure  loss of balance or coordination  palpitations  redness, blistering, peeling or loosening of the skin, including inside the mouth  restlessness, pacing, inability to keep still  seizures  stiff muscles  suicidal thoughts or other mood changes  trouble passing urine or change in the amount of urine  trouble sleeping  unusual bleeding or bruising  unusually weak or tired  vomiting Side effects that usually do not require medical attention (report to your doctor or health care professional if they continue or are bothersome):  change in sex drive or performance  change in appetite or weight  constipation  dizziness  dry mouth  headache  increased sweating  nausea  tired This list may not describe all possible side effects. Call your doctor for medical advice about side effects. You may report side effects to FDA at 1-800-FDA-1088. Where should I keep my medicine? Keep out of the reach of children. Store at a controlled temperature between 20 and 25 degrees C (68 degrees and 77 degrees F), in a dry place. Throw away any unused medicine after the expiration date. NOTE: This sheet is a summary. It may not cover all possible information. If you have questions about this medicine, talk to your doctor, pharmacist, or health care provider.  2020 Elsevier/Gold Standard (2018-03-03 12:06:43)  

## 2020-01-13 DIAGNOSIS — F4323 Adjustment disorder with mixed anxiety and depressed mood: Secondary | ICD-10-CM | POA: Diagnosis not present

## 2020-01-20 DIAGNOSIS — F4323 Adjustment disorder with mixed anxiety and depressed mood: Secondary | ICD-10-CM | POA: Diagnosis not present

## 2020-01-24 ENCOUNTER — Ambulatory Visit: Payer: Medicaid Other | Admitting: Obstetrics

## 2020-01-27 DIAGNOSIS — F4323 Adjustment disorder with mixed anxiety and depressed mood: Secondary | ICD-10-CM | POA: Diagnosis not present

## 2020-02-02 ENCOUNTER — Other Ambulatory Visit: Payer: Self-pay

## 2020-02-02 ENCOUNTER — Encounter: Payer: Self-pay | Admitting: Obstetrics

## 2020-02-02 ENCOUNTER — Other Ambulatory Visit (HOSPITAL_COMMUNITY)
Admission: RE | Admit: 2020-02-02 | Discharge: 2020-02-02 | Disposition: A | Discharge status: Home / Self Care | Payer: Medicaid Other | Source: Ambulatory Visit | Attending: Obstetrics | Admitting: Obstetrics

## 2020-02-02 ENCOUNTER — Ambulatory Visit (INDEPENDENT_AMBULATORY_CARE_PROVIDER_SITE_OTHER): Payer: Medicaid Other | Admitting: Obstetrics

## 2020-02-02 VITALS — BP 119/82 | HR 92 | Ht 62.0 in | Wt 222.0 lb

## 2020-02-02 DIAGNOSIS — N898 Other specified noninflammatory disorders of vagina: Secondary | ICD-10-CM

## 2020-02-02 DIAGNOSIS — Z01419 Encounter for gynecological examination (general) (routine) without abnormal findings: Secondary | ICD-10-CM

## 2020-02-02 DIAGNOSIS — N644 Mastodynia: Secondary | ICD-10-CM

## 2020-02-02 DIAGNOSIS — Z30011 Encounter for initial prescription of contraceptive pills: Secondary | ICD-10-CM | POA: Diagnosis not present

## 2020-02-02 DIAGNOSIS — L680 Hirsutism: Secondary | ICD-10-CM

## 2020-02-02 DIAGNOSIS — Z6841 Body Mass Index (BMI) 40.0 and over, adult: Secondary | ICD-10-CM

## 2020-02-02 DIAGNOSIS — R102 Pelvic and perineal pain: Secondary | ICD-10-CM

## 2020-02-02 DIAGNOSIS — Z3009 Encounter for other general counseling and advice on contraception: Secondary | ICD-10-CM | POA: Diagnosis not present

## 2020-02-02 MED ORDER — NORGESTIMATE-ETH ESTRADIOL 0.25-35 MG-MCG PO TABS: 1.0000 | ORAL_TABLET | Freq: Every day | ORAL | 11 refills | Status: DC

## 2020-02-02 NOTE — Progress Notes (Signed)
Last pap 10/2018- normal   Pt states that she would like hormone check. Pt states she is having some cramping, breast pain and increase in facial hair.   Pt declines std testing today.

## 2020-02-02 NOTE — Progress Notes (Signed)
Subjective:        Tricia Solomon is a 24 y.o. female here for a routine exam.  Current complaints: Pelvic pain, vaginal discharge and occasional breast pain.  Has also noticed an increased facial hair growth and on chin.  Periods are regular and normal duration and flow.  History of GDM during pregnancy.  Recent Hgb A1c was 5.2.    Personal health questionnaire:  Is patient Ashkenazi Jewish, have a family history of breast and/or ovarian cancer: no Is there a family history of uterine cancer diagnosed at age < 41, gastrointestinal cancer, urinary tract cancer, family member who is a Personnel officer syndrome-associated carrier: no Is the patient overweight and hypertensive, family history of diabetes, personal history of gestational diabetes, preeclampsia or PCOS: no Is patient over 70, have PCOS,  family history of premature CHD under age 30, diabetes, smoke, have hypertension or peripheral artery disease:  no At any time, has a partner hit, kicked or otherwise hurt or frightened you?: no Over the past 2 weeks, have you felt down, depressed or hopeless?: no Over the past 2 weeks, have you felt little interest or pleasure in doing things?:no   Gynecologic History Patient's last menstrual period was 01/14/2020. Contraception: none Last Pap: 11-10-2018. Results were: normal Last mammogram: n/a. Results were: n/a  Obstetric History OB History  Gravida Para Term Preterm AB Living  1 1 1     1   SAB TAB Ectopic Multiple Live Births        0 1    # Outcome Date GA Lbr Len/2nd Weight Sex Delivery Anes PTL Lv  1 Term 05/15/19 [redacted]w[redacted]d 01:48 / 00:12 6 lb 9.6 oz (2.995 kg) M Vag-Spont Local  LIV    Past Medical History:  Diagnosis Date  . Depression   . Diabetes mellitus without complication (HCC)    GDM  . Diet controlled gestational diabetes mellitus (GDM) in third trimester 03/07/2019   Some elevated glucose values on 1/5 but pt taking some values at 1 hour, not 2.  Reeval in 2 weeks.  .  Gestational diabetes   . Supervision of normal first pregnancy, antepartum 10/13/2018   BABYSCRIPTS PATIENT: [x ]Initial [x ]12 [ ] 20 [ ] 28 [ ] 32 [ ] 36 [ ] 38 [ ] 39 [ ] 40  Nursing Staff Provider Office Location  [place]  Dating  LMP Language  English Anatomy 10/15/2018  EIF, bilat [facility] EFW 43%tile at 34 weeks (5lbs 2 oz) Flu Vaccine  03-02-19 Genetic Screen  NIPS: low risk  AFP:      TDaP vaccine  03-02-19 Hgb A1C or  GTT Early 5.0 Third trimester   Ref. Range 03/02/2019 08:58 Glucose, 1 hour Late    Past Surgical History:  Procedure Laterality Date  . NO PAST SURGERIES       Current Outpatient Medications:  .  venlafaxine XR (EFFEXOR XR) 75 MG 24 hr capsule, Take one daily with breakfast for one week and then increase to one twice daily., Disp: 60 capsule, Rfl: 1 .  hydrocortisone (ANUSOL-HC) 2.5 % rectal cream, Place 1 application rectally 2 (two) times daily., Disp: 30 g, Rfl: 2 .  ibuprofen (ADVIL) 600 MG tablet, Take 1 tablet (600 mg total) by mouth every 6 (six) hours. (Patient not taking: Reported on 06/15/2019), Disp: 30 tablet, Rfl: 0 .  norgestimate-ethinyl estradiol (ORTHO-CYCLEN) 0.25-35 MG-MCG tablet, Take 1 tablet by mouth daily., Disp: 28 tablet, Rfl: 11 .  Prenatal Vit-Fe Fumarate-FA (PRENATAL MULTIVITAMIN) TABS tablet, Take 1 tablet by mouth  daily at 12 noon., Disp: , Rfl:  No Known Allergies  Social History   Tobacco Use  . Smoking status: Former Games developer  . Smokeless tobacco: Never Used  Substance Use Topics  . Alcohol use: Not Currently    Family History  Problem Relation Age of Onset  . Heart disease Maternal Grandmother   . Heart disease Paternal Grandfather       Review of Systems  Constitutional: negative for fatigue and weight loss Respiratory: negative for cough and wheezing Cardiovascular: negative for chest pain, fatigue and palpitations Gastrointestinal: negative for abdominal pain and change in bowel habits Musculoskeletal:negative for myalgias Neurological: negative  for gait problems and tremors Behavioral/Psych: negative for abusive relationship, depression Endocrine: negative for temperature intolerance    Genitourinary:negative for abnormal menstrual periods, genital lesions, hot flashes, sexual problems.  Positive for  vaginal discharge Integument/breast: negative for breast lump or nipple discharge.  Positive for occasional breast tenderness.  Positive for abnormal facial hair growth    Objective:       BP 119/82   Pulse 92   Ht 5\' 2"  (1.575 m)   Wt 222 lb (100.7 kg)   LMP 01/14/2020   Breastfeeding No   BMI 40.60 kg/m  General:   alert and no distress  Skin:    Facial hair  Lungs:   clear to auscultation bilaterally  Heart:   regular rate and rhythm, S1, S2 normal, no murmur, click, rub or gallop  Breasts:   normal without suspicious masses, skin or nipple changes or axillary nodes  Abdomen:  normal findings: no organomegaly, soft, non-tender and no hernia  Pelvis:  External genitalia: normal general appearance Urinary system: urethral meatus normal and bladder without fullness, nontender Vaginal: normal without tenderness, induration or masses Cervix: normal appearance Adnexa: normal bimanual exam Uterus: anteverted and non-tender, normal size   Lab Review Urine pregnancy test Labs reviewed yes Radiologic studies reviewed yes  50% of 20 min visit spent on counseling and coordination of care.   Assessment:     1. Encounter for gynecological examination with Papanicolaou smear of cervix Rx: - Cytology - PAP( [place])  2. Pelvic pain Rx: - 01/16/2020 PELVIC COMPLETE WITH TRANSVAGINAL; Future  3. Vaginal discharge Rx: - Cervicovaginal ancillary only( [facility])  4. Hirsutism Rx: - Testosterone, Free, Total, SHBG  5. Mastodynia - will continue to monitor  6. Class 3 severe obesity due to excess calories without serious comorbidity with body mass index (BMI) of 40.0 to 44.9 in adult [facility]) - program of caloric reduction,  exercise and behavioral modification recommended  7. Encounter for other general counseling and advice on contraception - OCP's recommended and agreed to - will Rx a low androgen pill  8. Encounter for initial prescription of contraceptive pills Rx: - norgestimate-ethinyl estradiol (ORTHO-CYCLEN) 0.25-35 MG-MCG tablet; Take 1 tablet by mouth daily.  Dispense: 28 tablet; Refill: 11   Plan:    Education reviewed: calcium supplements, depression evaluation, low fat, low cholesterol diet, safe sex/STD prevention, self breast exams and weight bearing exercise. Contraception: OCP (estrogen/progesterone). Follow up in: 2 weeks.   Meds ordered this encounter  Medications  . norgestimate-ethinyl estradiol (ORTHO-CYCLEN) 0.25-35 MG-MCG tablet    Sig: Take 1 tablet by mouth daily.    Dispense:  28 tablet    Refill:  11   Orders Placed This Encounter  Procedures  . 12-09-1998 PELVIC COMPLETE WITH TRANSVAGINAL    Standing Status:   Future    Standing Expiration Date:  02/01/2021    Order Specific Question:   Reason for Exam (SYMPTOM  OR DIAGNOSIS REQUIRED)    Answer:   Hirsutism.  Obesity.  Pelvic pain.    Order Specific Question:   Preferred imaging location?    Answer:   [facility]  . Testosterone, Free, Total, SHBG    Brock Bad, MD 02/02/2020 2:19 PM

## 2020-02-03 ENCOUNTER — Other Ambulatory Visit: Payer: Self-pay | Admitting: Obstetrics

## 2020-02-03 DIAGNOSIS — N760 Acute vaginitis: Secondary | ICD-10-CM

## 2020-02-03 DIAGNOSIS — B9689 Other specified bacterial agents as the cause of diseases classified elsewhere: Secondary | ICD-10-CM

## 2020-02-03 LAB — CERVICOVAGINAL ANCILLARY ONLY
Bacterial Vaginitis (gardnerella): POSITIVE — AB
Candida Glabrata: NEGATIVE
Candida Vaginitis: NEGATIVE
Chlamydia: NEGATIVE
Comment: NEGATIVE
Comment: NEGATIVE
Comment: NEGATIVE
Comment: NEGATIVE
Comment: NEGATIVE
Comment: NORMAL
Neisseria Gonorrhea: NEGATIVE
Trichomonas: NEGATIVE

## 2020-02-03 LAB — TESTOSTERONE, FREE, TOTAL, SHBG
Sex Hormone Binding: 26 nmol/L (ref 24.6–122.0)
Testosterone, Free: 2.9 pg/mL (ref 0.0–4.2)
Testosterone: 37 ng/dL (ref 13–71)

## 2020-02-03 MED ORDER — METRONIDAZOLE 500 MG PO TABS: 500.0000 mg | ORAL_TABLET | Freq: Two times a day (BID) | ORAL | 2 refills | Status: DC

## 2020-02-04 ENCOUNTER — Telehealth: Payer: Self-pay

## 2020-02-04 NOTE — Telephone Encounter (Signed)
Returned call, no answer, left vm 

## 2020-02-06 ENCOUNTER — Other Ambulatory Visit: Payer: Self-pay | Admitting: Family Medicine

## 2020-02-06 DIAGNOSIS — F418 Other specified anxiety disorders: Secondary | ICD-10-CM

## 2020-02-07 LAB — CYTOLOGY - PAP: Diagnosis: NEGATIVE

## 2020-02-08 ENCOUNTER — Ambulatory Visit: Payer: Medicaid Other | Admitting: Nurse Practitioner

## 2020-02-10 DIAGNOSIS — F4323 Adjustment disorder with mixed anxiety and depressed mood: Secondary | ICD-10-CM | POA: Diagnosis not present

## 2020-02-15 ENCOUNTER — Other Ambulatory Visit: Payer: Self-pay

## 2020-02-15 ENCOUNTER — Ambulatory Visit
Admission: RE | Admit: 2020-02-15 | Discharge: 2020-02-15 | Disposition: A | Discharge status: Home / Self Care | Payer: Medicaid Other | Source: Ambulatory Visit | Attending: Obstetrics | Admitting: Obstetrics

## 2020-02-15 DIAGNOSIS — R102 Pelvic and perineal pain: Secondary | ICD-10-CM | POA: Insufficient documentation

## 2020-02-15 DIAGNOSIS — Z872 Personal history of diseases of the skin and subcutaneous tissue: Secondary | ICD-10-CM | POA: Diagnosis not present

## 2020-02-15 DIAGNOSIS — N8301 Follicular cyst of right ovary: Secondary | ICD-10-CM | POA: Diagnosis not present

## 2020-02-15 DIAGNOSIS — E282 Polycystic ovarian syndrome: Secondary | ICD-10-CM | POA: Diagnosis not present

## 2020-02-15 DIAGNOSIS — E669 Obesity, unspecified: Secondary | ICD-10-CM | POA: Diagnosis not present

## 2020-02-23 ENCOUNTER — Telehealth (INDEPENDENT_AMBULATORY_CARE_PROVIDER_SITE_OTHER): Payer: Medicaid Other | Admitting: Obstetrics

## 2020-02-23 ENCOUNTER — Encounter: Payer: Self-pay | Admitting: Obstetrics

## 2020-02-23 DIAGNOSIS — L680 Hirsutism: Secondary | ICD-10-CM

## 2020-02-23 DIAGNOSIS — Z6841 Body Mass Index (BMI) 40.0 and over, adult: Secondary | ICD-10-CM | POA: Diagnosis not present

## 2020-02-23 DIAGNOSIS — E282 Polycystic ovarian syndrome: Secondary | ICD-10-CM

## 2020-02-23 NOTE — Progress Notes (Signed)
GYNECOLOGY VIRTUAL VISIT ENCOUNTER NOTE  Provider location: Center for [facility]   I connected with [name] on 02/23/20 at  1:00 PM EST by MyChart Video Encounter at home and verified that I am speaking with the correct person using two identifiers.   I discussed the limitations, risks, security and privacy concerns of performing an evaluation and management service virtually and the availability of in person appointments. I also discussed with the patient that there may be a patient responsible charge related to this service. The patient expressed understanding and agreed to proceed.   History:  Tricia Solomon is a 24 y.o. G61P1001 female being evaluated today for results of ultrasound and labs. She denies any abnormal vaginal discharge, bleeding, pelvic pain or other concerns.       Past Medical History:  Diagnosis Date  . Depression   . Diabetes mellitus without complication (HCC)    GDM  . Diet controlled gestational diabetes mellitus (GDM) in third trimester 03/07/2019   Some elevated glucose values on 1/5 but pt taking some values at 1 hour, not 2.  Reeval in 2 weeks.  . Gestational diabetes   . Supervision of normal first pregnancy, antepartum 10/13/2018   BABYSCRIPTS PATIENT: [x ]Initial [x ]12 [ ] 20 [ ] 28 [ ] 32 [ ] 36 [ ] 38 [ ] 39 [ ] 40  Nursing Staff Provider Office Location  [facility]  Dating  LMP Language  English Anatomy  EIF, bilat CPC EFW 43%tile at 34 weeks (5lbs 2 oz) Flu Vaccine  03-02-19 Genetic Screen  NIPS: low risk  AFP:      TDaP vaccine  03-02-19 Hgb A1C or  GTT Early 5.0 Third trimester   Ref. Range 03/02/2019 08:58 Glucose, 1 hour Late   Past Surgical History:  Procedure Laterality Date  . NO PAST SURGERIES     The following portions of the patient's history were reviewed and updated as appropriate: allergies, current medications, past family history, past medical history, past social history, past surgical history and problem list.   Health  Maintenance:  Normal pap and negative HRHPV on 03-03-2020.   Review of Systems:  Pertinent items noted in HPI and remainder of comprehensive ROS otherwise negative.  Physical Exam:   General:  Alert, oriented and cooperative. Patient appears to be in no acute distress.  Mental Status: Normal mood and affect. Normal behavior. Normal judgment and thought content.   Respiratory: Normal respiratory effort, no problems with respiration noted  Rest of physical exam deferred due to type of encounter  Labs and Imaging No results found for this or any previous visit (from the past 336 hour(s)). PELVIC COMPLETE WITH TRANSVAGINAL  Result Date: 02/15/2020 CLINICAL DATA:  Hirsutism, obesity, pelvic pain, G1P1 with vaginal delivery on 05/15/2019; LMP 02/09/2020 EXAM: TRANSABDOMINAL AND TRANSVAGINAL ULTRASOUND OF PELVIS TECHNIQUE: Both transabdominal and transvaginal ultrasound examinations of the pelvis were performed. Transabdominal technique was performed for global imaging of the pelvis including uterus, ovaries, adnexal regions, and pelvic cul-de-sac. It was necessary to proceed with endovaginal exam following the transabdominal exam to visualize the LEFT ovary. COMPARISON:  None FINDINGS: Uterus Measurements: 9.9 x 3.9 x 5.5 cm = volume: 110 mL. Anteverted. Slightly heterogeneous myometrium. No focal mass. Endometrium Thickness: 10 mm. No endometrial fluid or mass. Tiny amount of nonspecific endocervical fluid noted. Right ovary Measurements: 3.1 x 2.1 x 3.2 cm = volume: 11.0 mL. Numerous peripheral follicles without dominant mass Left ovary Measurements: 2.5 x 1.6 x 2.4 cm =  volume: 4.8 mL. Multiple follicles without mass Other findings No free pelvic fluid fluid or adnexal masses. IMPRESSION: Unremarkable uterus and endometrial complex. Numerous follicles throughout the RIGHT ovary, slightly fewer on LEFT, nonspecific but can be seen in patients with polycystic ovarian syndrome. No other pelvic  sonographic abnormalities identified. Electronically Signed   By: Ulyses Southward M.D.   On: 02/15/2020 17:35       Assessment and Plan:     1. 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 PCOS (polycystic ovarian syndrome) - ultrasound reveals polycystic ovaries - testosterone panel is WNL's  3. Class 3 severe obesity due to excess calories without serious comorbidity with body mass index (BMI) of 40.0 to 44.9 in adult [facility]) - program of caloric reduction, exercise and behavioral modification recommended       I discussed the assessment and treatment plan with the patient. The patient was provided an opportunity to ask questions and all were answered. The patient agreed with the plan and demonstrated an understanding of the instructions.   The patient was advised to call back or seek an in-person evaluation/go to the ED if the symptoms worsen or if the condition fails to improve as anticipated.  I provided 15 minutes of face-to-face time during this encounter.   Coral Ceo, MD Center for [facility], [facility] 02/23/20

## 2020-02-24 DIAGNOSIS — F4323 Adjustment disorder with mixed anxiety and depressed mood: Secondary | ICD-10-CM | POA: Diagnosis not present

## 2020-03-02 DIAGNOSIS — F4323 Adjustment disorder with mixed anxiety and depressed mood: Secondary | ICD-10-CM | POA: Diagnosis not present

## 2020-03-09 DIAGNOSIS — F4323 Adjustment disorder with mixed anxiety and depressed mood: Secondary | ICD-10-CM | POA: Diagnosis not present

## 2020-03-10 ENCOUNTER — Encounter: Payer: Self-pay | Admitting: Family Medicine

## 2020-03-18 ENCOUNTER — Other Ambulatory Visit: Payer: Self-pay | Admitting: Family Medicine

## 2020-03-18 DIAGNOSIS — F418 Other specified anxiety disorders: Secondary | ICD-10-CM

## 2020-03-23 DIAGNOSIS — F4323 Adjustment disorder with mixed anxiety and depressed mood: Secondary | ICD-10-CM | POA: Diagnosis not present

## 2020-04-03 DIAGNOSIS — F4323 Adjustment disorder with mixed anxiety and depressed mood: Secondary | ICD-10-CM | POA: Diagnosis not present

## 2020-05-01 DIAGNOSIS — F4323 Adjustment disorder with mixed anxiety and depressed mood: Secondary | ICD-10-CM | POA: Diagnosis not present

## 2020-05-10 ENCOUNTER — Encounter: Payer: Self-pay | Admitting: Obstetrics

## 2020-05-10 ENCOUNTER — Telehealth (INDEPENDENT_AMBULATORY_CARE_PROVIDER_SITE_OTHER): Payer: Medicaid Other | Admitting: Obstetrics

## 2020-05-10 DIAGNOSIS — Z3009 Encounter for other general counseling and advice on contraception: Secondary | ICD-10-CM | POA: Diagnosis not present

## 2020-05-10 NOTE — Progress Notes (Signed)
GYNECOLOGY VIRTUAL VISIT ENCOUNTER NOTE  Provider location: [facility] for [facility]   I connected with Tricia Solomon on 05/10/20 at  1:30 PM EST by MyChart Video Encounter at home and verified that I am speaking with the correct person using two identifiers.   I discussed the limitations, risks, security and privacy concerns of performing an evaluation and management service virtually and the availability of in person appointments. I also discussed with the patient that there may be a patient responsible charge related to this service. The patient expressed understanding and agreed to proceed.   History:  Tricia Solomon is a 25 y.o. G64P1001 female being evaluated today for contraceptive management.  She missed a pill and then doubled up the following day, which caused HA, diarrhea, irregular bleeding and insomnia.  She denies any abnormal vaginal discharge,  pelvic pain or other concerns.       Past Medical History:  Diagnosis Date  . Depression   . Diabetes mellitus without complication (HCC)    GDM  . Diet controlled gestational diabetes mellitus (GDM) in third trimester 03/07/2019   Some elevated glucose values on 1/5 but pt taking some values at 1 hour, not 2.  Reeval in 2 weeks.  . Gestational diabetes   . Supervision of normal first pregnancy, antepartum 10/13/2018   BABYSCRIPTS PATIENT: [x ]Initial [x ]12 [ ] 20 [ ] 28 [ ] 32 [ ] 36 [ ] 38 [ ] 39 [ ] 40  Nursing Staff Provider Office Location  [facility]  Dating  LMP Language  English Anatomy  EIF, bilat CPC EFW 43%tile at 34 weeks (5lbs 2 oz) Flu Vaccine  03-02-19 Genetic Screen  NIPS: low risk  AFP:      TDaP vaccine  03-02-19 Hgb A1C or  GTT Early 5.0 Third trimester   Ref. Range 03/02/2019 08:58 Glucose, 1 hour Late   Past Surgical History:  Procedure Laterality Date  . NO PAST SURGERIES     The following portions of the patient's history were reviewed and updated as appropriate: allergies, current medications, past  family history, past medical history, past social history, past surgical history and problem list.   Health Maintenance:  Normal pap and negative HRHPV on 02-08-2020.    Review of Systems:  Pertinent items noted in HPI and remainder of comprehensive ROS otherwise negative.  Physical Exam:   General:  Alert, oriented and cooperative. Patient appears to be in no acute distress.  Mental Status: Normal mood and affect. Normal behavior. Normal judgment and thought content.   Respiratory: Normal respiratory effort, no problems with respiration noted  Rest of physical exam deferred due to type of encounter  Labs and Imaging No results found for this or any previous visit (from the past 336 hour(s)). No results found.     Assessment and Plan:     1. Encounter for other general counseling and advice on contraception - had side effect of taking high dose of OCP's.   - patient reassured that these symptoms will clear, and she should continue OCP's daily as directed     I discussed the assessment and treatment plan with the patient. The patient was provided an opportunity to ask questions and all were answered. The patient agreed with the plan and demonstrated an understanding of the instructions.   The patient was advised to call back or seek an in-person evaluation/go to the ED if the symptoms worsen or if the condition fails to improve as anticipated.  I provided 15 minutes  of face-to-face time during this encounter.   Coral Ceo, MD Center for [facility], [facility] 05/10/20

## 2020-05-10 NOTE — Progress Notes (Signed)
S/w pt for virtual visit for [facility] consult. Pt reports that she has been on [facility] pills since Nov 2021, she recently missed 1 pill 2 weeks ago, and resumed with 2 pills the next day, but since then she has been having diarrhea, irregular bleeding, migraines, and insomnia.

## 2020-05-19 ENCOUNTER — Telehealth (INDEPENDENT_AMBULATORY_CARE_PROVIDER_SITE_OTHER): Payer: Medicaid Other | Admitting: Family Medicine

## 2020-05-19 ENCOUNTER — Encounter: Payer: Self-pay | Admitting: Family Medicine

## 2020-05-19 VITALS — Ht 62.0 in | Wt 230.0 lb

## 2020-05-19 DIAGNOSIS — Z20822 Contact with and (suspected) exposure to covid-19: Secondary | ICD-10-CM | POA: Diagnosis not present

## 2020-05-19 NOTE — Progress Notes (Signed)
Virtual Visit via Video Note  I connected with Tricia Solomon on 05/19/20 at  2:00 PM EST by a video enabled telemedicine application and verified that I am speaking with the correct person using two identifiers. Location patient: home Location provider: home office Persons participating in the virtual visit: patient, provider  I discussed the limitations of evaluation and management by telemedicine and the availability of in person appointments. The patient expressed understanding and agreed to proceed.  Chief Complaint  Patient presents with  . Acute Visit    C/o having ST, body aches, bilateral ear pain,sinus pressure, fever (100), cough, loss of taste & smell  x 5 days.   She has tried Dayquil, Nyquil, Cold and Flu meds with little relief.     HPI: Tricia Solomon is a 25 y.o. female patient of Dr. Doreene Burke who complains of 5 day h/o sore throat, myalgias, sinus pressure, ear pressure, low grade fever (Tmax 100). Pt lost taste and smell on Wed PM and has not returned as of yet. Body aches and fever have resolved.  She has tried OTC meds - day and nyquil. Overall symptoms improving. Still with some ear pressure/pain B/L.   Past Medical History:  Diagnosis Date  . Depression   . Diabetes mellitus without complication (HCC)    GDM  . Diet controlled gestational diabetes mellitus (GDM) in third trimester 03/07/2019   Some elevated glucose values on 1/5 but pt taking some values at 1 hour, not 2.  Reeval in 2 weeks.  . Gestational diabetes   . Supervision of normal first pregnancy, antepartum 10/13/2018   BABYSCRIPTS PATIENT: [x ]Initial [x ]12 [ ] 20 [ ] 28 [ ] 32 [ ] 36 [ ] 38 [ ] 39 [ ] 40  Nursing Staff Provider Office Location  [facility]  Dating  LMP Language  English Anatomy  EIF, bilat CPC EFW 43%tile at 34 weeks (5lbs 2 oz) Flu Vaccine  03-02-19 Genetic Screen  NIPS: low risk  AFP:      TDaP vaccine  03-02-19 Hgb A1C or  GTT Early 5.0 Third trimester   Ref. Range 03/02/2019 08:58 Glucose, 1  hour Late    Past Surgical History:  Procedure Laterality Date  . NO PAST SURGERIES      Family History  Problem Relation Age of Onset  . Heart disease Maternal Grandmother   . Heart disease Paternal Grandfather     Social History   Tobacco Use  . Smoking status: Former  . Smokeless tobacco: Never Used  Vaping Use  . Vaping Use: Never used  Substance Use Topics  . Alcohol use: Not Currently  . Drug use: Not Currently    Types: Marijuana    Comment: last used prior to pregnancy     Current Outpatient Medications:  .  norgestimate-ethinyl estradiol (ORTHO-CYCLEN) 0.25-35 MG-MCG tablet, Take 1 tablet by mouth daily., Disp: 28 tablet, Rfl: 11 .  venlafaxine XR (EFFEXOR-XR) 75 MG 24 hr capsule, TAKE 1 CAPSULE BY MOUTH DAILY WITH BREAKFAST FOR 1 WEEK. INCREASE TO 1 CAPSULE TWICE DAILY, Disp: 60 capsule, Rfl: 1 .  hydrocortisone (ANUSOL-HC) 2.5 % rectal cream, Place 1 application rectally 2 (two) times daily. (Patient not taking: No sig reported), Disp: 30 g, Rfl: 2 .  ibuprofen (ADVIL) 600 MG tablet, Take 1 tablet (600 mg total) by mouth every 6 (six) hours. (Patient not taking: No sig reported), Disp: 30 tablet, Rfl: 0 .  metroNIDAZOLE (FLAGYL) 500 MG tablet, Take 1 tablet (500 mg total) by mouth 2 (two)  times daily. (Patient not taking: No sig reported), Disp: 14 tablet, Rfl: 2 .  Prenatal Vit-Fe Fumarate-FA (PRENATAL MULTIVITAMIN) TABS tablet, Take 1 tablet by mouth daily at 12 noon. (Patient not taking: No sig reported), Disp: , Rfl:  .  sertraline (ZOLOFT) 50 MG tablet, TAKE 1 TABLET(50 MG) BY MOUTH DAILY (Patient not taking: No sig reported), Disp: 30 tablet, Rfl: 3  No Known Allergies    ROS: See pertinent positives and negatives per HPI.   EXAM:  VITALS per patient if applicable: Ht 5\' 2"  (1.575 m)   Wt 230 lb (104.3 kg) Comment: pt reported  BMI 42.07 kg/m    GENERAL: alert, oriented, appears well and in no acute distress  HEENT: atraumatic,  conjunctiva clear, no obvious abnormalities on inspection of external nose and ears  NECK: normal movements of the head and neck  LUNGS: on inspection no signs of respiratory distress, breathing rate appears normal, no obvious gross SOB, gasping or wheezing, no conversational dyspnea  CV: no obvious cyanosis  PSYCH/NEURO: pleasant and cooperative, speech and thought processing grossly intact   ASSESSMENT AND PLAN:  1. 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 The patient was provided an opportunity to ask questions and all were answered. The patient agreed with the plan and demonstrated an understanding of the instructions.   The patient was advised to call back or seek an in-person evaluation if the symptoms worsen or if the condition fails to improve as anticipated.   9-10, DO

## 2020-05-29 DIAGNOSIS — F4323 Adjustment disorder with mixed anxiety and depressed mood: Secondary | ICD-10-CM | POA: Diagnosis not present

## 2020-06-03 IMAGING — US US MFM OB DETAIL+14 WK
1 series · 13 of 28 positions shown · non-contrast
Comparison: none

[Series 1: us mfm ob detail+14 wk · 13 of 87 slices shown]
[im 4/87]
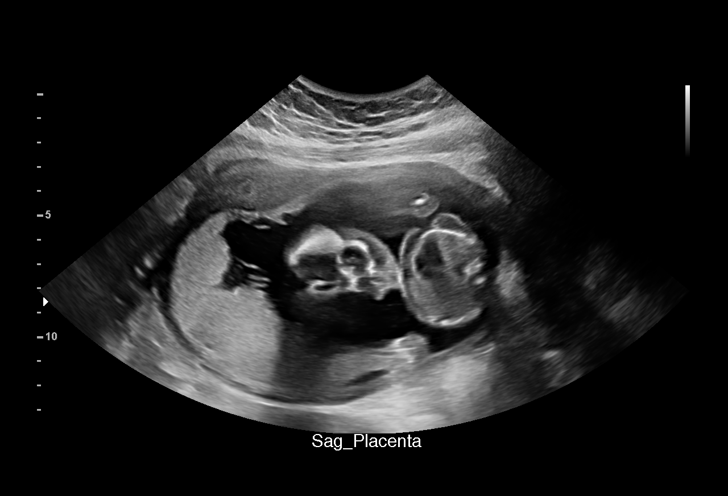
[im 10/87]
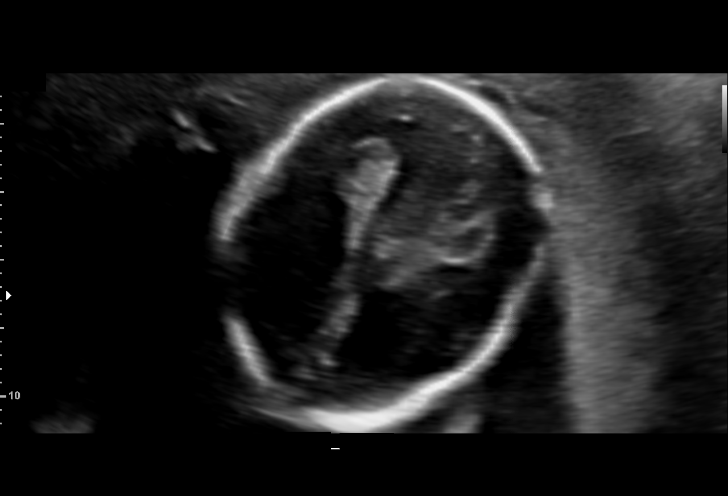
[im 16/87]
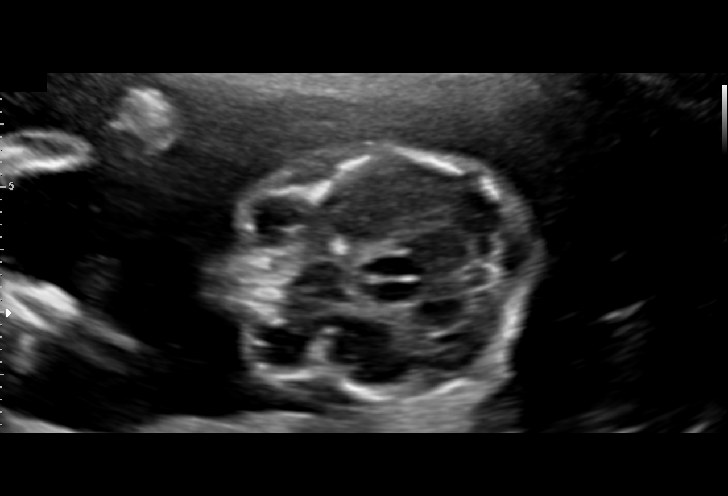
[im 23/87]
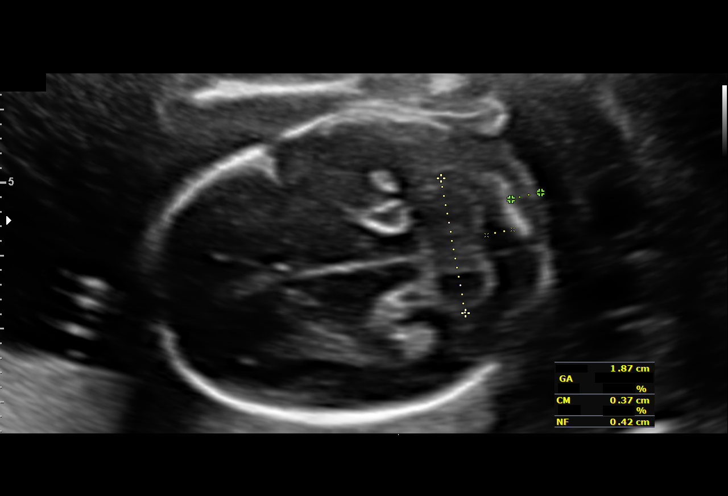
[im 29/87]
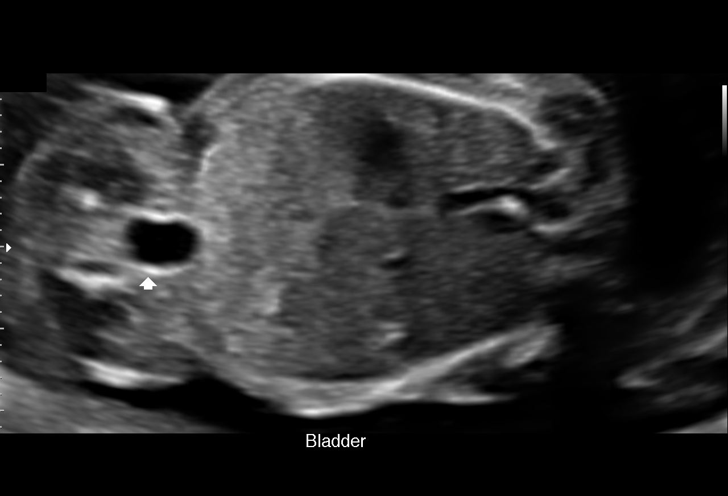
[im 36/87]
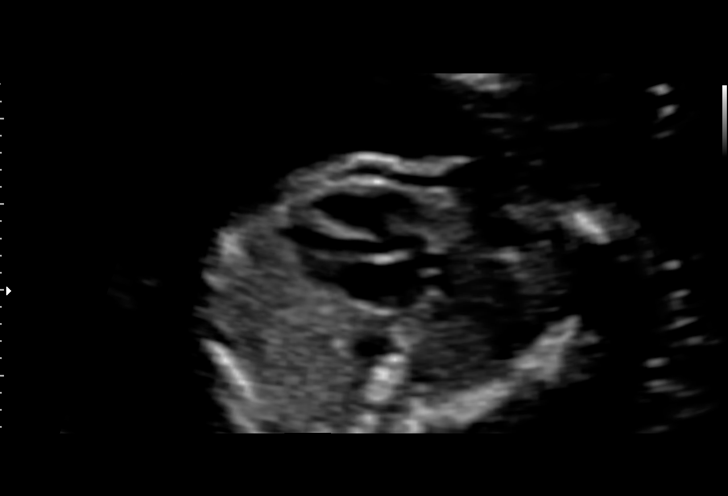
[im 45/87]
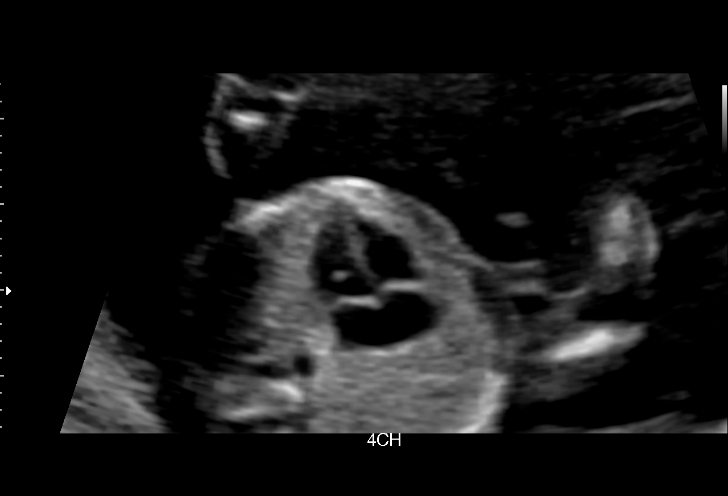
[im 51/87]
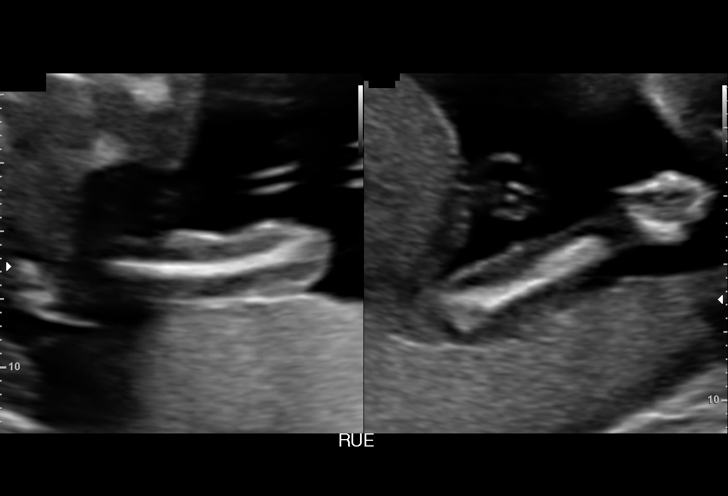
[im 58/87]
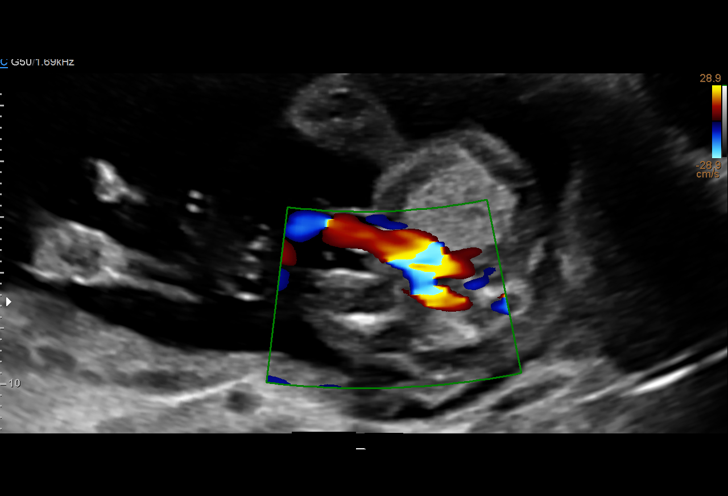
[im 64/87]
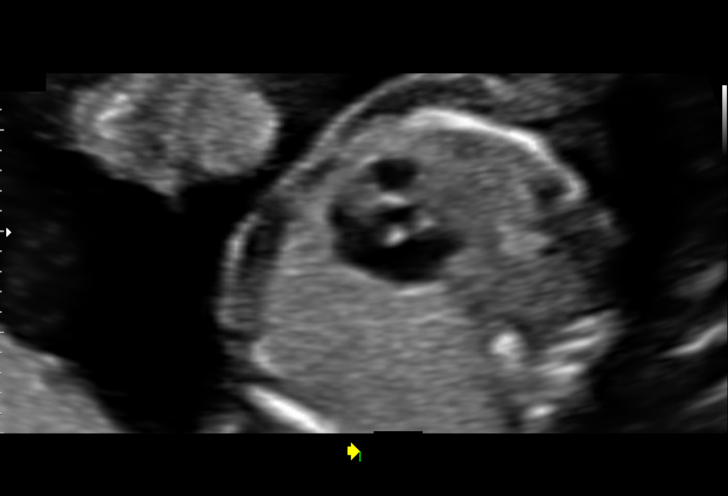
[im 71/87]
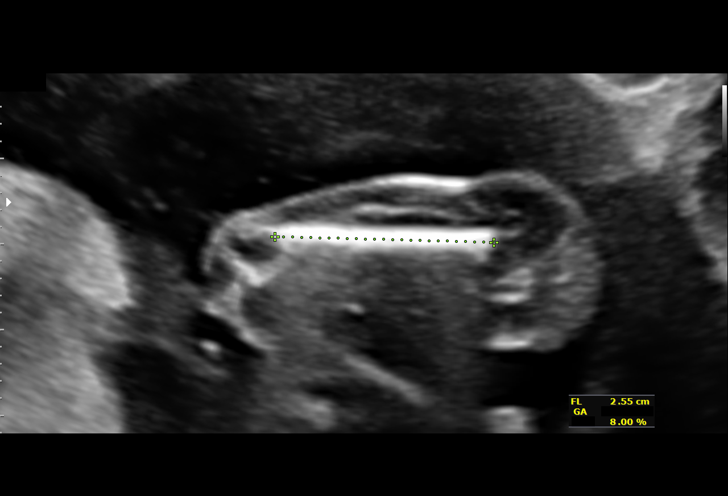
[im 77/87]
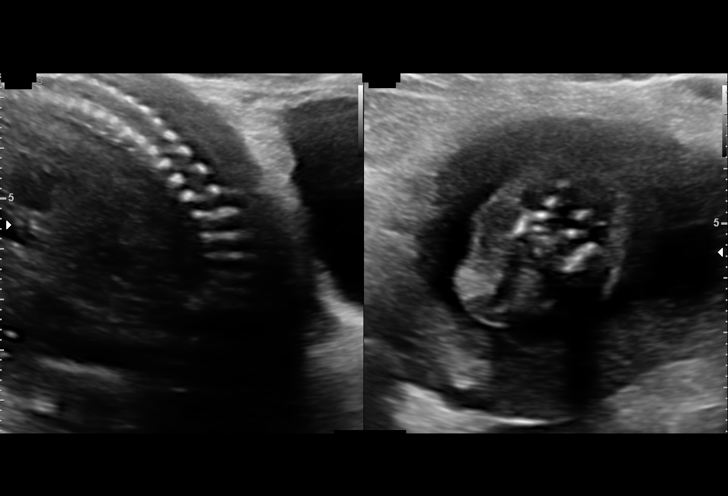
[im 83/87]
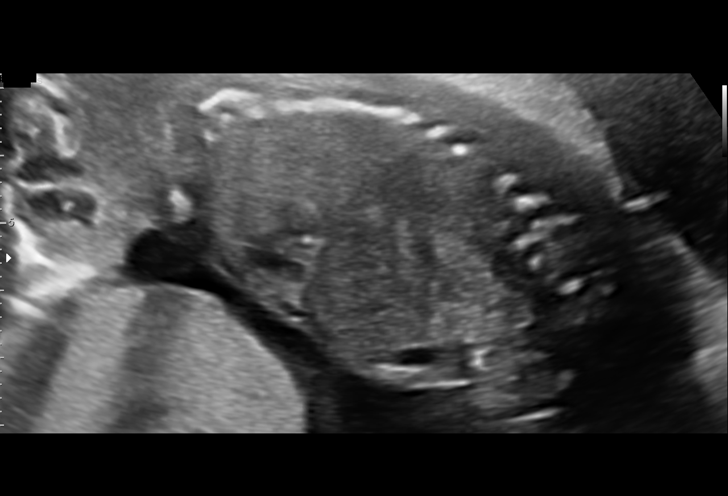

[13 of 28 positions shown; findings below may reference images not displayed]

----------------------------------------------------------------------

 ----------------------------------------------------------------------
Indications

  Encounter for antenatal screening for
  malformations (Low Risk NIPS)
  Obesity complicating pregnancy (BMI 37)
  19 weeks gestation of pregnancy
 ----------------------------------------------------------------------
Vital Signs

 BMI:
Fetal Evaluation

 Num Of Fetuses:         1
 Fetal Heart Rate(bpm):  135
 Cardiac Activity:       Observed
 Presentation:           Variable
 Placenta:               Posterior
 P. Cord Insertion:      Visualized

 Amniotic Fluid
 AFI FV:      Within normal limits

                             Largest Pocket(cm)

Biometry

 BPD:      42.2  mm     G. Age:  18w 5d         41  %    CI:        73.09   %    70 - 86
                                                         FL/HC:      16.0   %    16.1 -
 HC:      156.9  mm     G. Age:  18w 4d         24  %    HC/AC:      1.21        1.09 -
 AC:      130.1  mm     G. Age:  18w 4d         31  %    FL/BPD:     59.5   %
 FL:       25.1  mm     G. Age:  17w 4d          6  %    FL/AC:      19.3   %    20 - 24
 HUM:      23.9  mm     G. Age:  17w 3d        < 5  %
 CER:      18.7  mm     G. Age:  18w 2d         31  %
 NFT:       4.2  mm

 LV:        8.1  mm
 CM:        3.7  mm

 Est. FW:     227  gm      0 lb 8 oz      9  %
OB History

 Gravidity:    1         Term:   0
 Living:       0
Gestational Age

 LMP:           19w 0d        Date:  08/25/18                 EDD:   06/01/19
 U/S Today:     18w 3d                                        EDD:   06/05/19
 Best:          19w 0d     Det. By:  LMP  (08/25/18)          EDD:   06/01/19
Anatomy

 Cranium:               Appears normal         Aortic Arch:            Appears normal
 Cavum:                 Appears normal         Ductal Arch:            Appears normal
 Ventricles:            Appears normal         Diaphragm:              Appears normal
 Choroid Plexus:        Bilateral choroid      Stomach:                Appears normal, left
                        plexus cysts
                                                                       sided
 Cerebellum:            Appears normal         Abdomen:                Appears normal
 Posterior Fossa:       Appears normal         Abdominal Wall:         Appears nml (cord
                                                                       insert, abd wall)
 Nuchal Fold:           Appears normal         Cord Vessels:           Appears normal (3
                                                                       vessel cord)
 Face:                  Appears normal         Kidneys:                Not well visualized
                        (orbits and profile)
 Lips:                  Appears normal         Bladder:                Appears normal
 Thoracic:              Appears normal         Spine:                  Ltd views no
                                                                       intracranial signs of
                                                                       NTD
 Heart:                 Echogenic focus        Upper Extremities:      Appears normal
                        in LV
 RVOT:                  Not well visualized    Lower Extremities:      Appears normal
 LVOT:                  Appears normal

 Other:  Male gender. Nasal bone visualized.
Cervix Uterus Adnexa

 Cervix
 Length:           4.37  cm.
 Normal appearance by transabdominal scan.

 Uterus
 No abnormality visualized.

 Left Ovary
 Size(cm)     3.11   x   1.47   x  2.37      Vol(ml):
 Within normal limits.

 Right Ovary
 Not visualized.
 Cul De Sac
 No free fluid seen.

 Adnexa
 No abnormality visualized.
Impression

 Normal interval growth.  No ultrasonic evidence of structural
 fetal anomalies.
 Low risk NIPS
 Bilateral choroid plexus cyst
 Echogenic intracardiac focus
 Suboptimal views of the fetal anatomy was obtained
 secondary to fetal position.

 I reviewed the isolated findings of todays visit. I explained in
 the context of a low risk cell free DNA there is little clinical
 significance.
Recommendations

 Follow up anatomy scheduled in 4 weeks.

## 2020-06-29 DIAGNOSIS — F4323 Adjustment disorder with mixed anxiety and depressed mood: Secondary | ICD-10-CM | POA: Diagnosis not present

## 2020-07-16 DIAGNOSIS — J029 Acute pharyngitis, unspecified: Secondary | ICD-10-CM | POA: Diagnosis not present

## 2020-07-16 DIAGNOSIS — J0140 Acute pansinusitis, unspecified: Secondary | ICD-10-CM | POA: Diagnosis not present

## 2020-07-18 ENCOUNTER — Other Ambulatory Visit: Payer: Self-pay | Admitting: Family

## 2020-07-18 DIAGNOSIS — F418 Other specified anxiety disorders: Secondary | ICD-10-CM

## 2020-07-24 DIAGNOSIS — F4323 Adjustment disorder with mixed anxiety and depressed mood: Secondary | ICD-10-CM | POA: Diagnosis not present

## 2020-07-28 NOTE — Telephone Encounter (Signed)
Med refill requested 4/26 sent to [facility]. Pt states the pharmacy told her we denied it. I do not see denial. Pt is out of medication. Can we send in today?  Call back # [phone]  [id number] [place], [place] - [address] AT [facility] [address] Phone:  [phone]  Fax:  [phone]

## 2020-08-14 DIAGNOSIS — F4323 Adjustment disorder with mixed anxiety and depressed mood: Secondary | ICD-10-CM | POA: Diagnosis not present

## 2020-08-23 ENCOUNTER — Ambulatory Visit: Payer: Medicaid Other | Admitting: Family Medicine

## 2020-08-28 ENCOUNTER — Ambulatory Visit: Payer: Medicaid Other | Admitting: Family Medicine

## 2020-08-28 ENCOUNTER — Other Ambulatory Visit: Payer: Self-pay

## 2020-08-28 ENCOUNTER — Encounter: Payer: Self-pay | Admitting: Family Medicine

## 2020-08-28 VITALS — BP 120/78 | HR 106 | Temp 97.6°F | Ht 62.0 in | Wt 222.4 lb

## 2020-08-28 DIAGNOSIS — F418 Other specified anxiety disorders: Secondary | ICD-10-CM

## 2020-08-28 DIAGNOSIS — F458 Other somatoform disorders: Secondary | ICD-10-CM

## 2020-08-28 DIAGNOSIS — G44209 Tension-type headache, unspecified, not intractable: Secondary | ICD-10-CM | POA: Diagnosis not present

## 2020-08-28 MED ORDER — METHOCARBAMOL 500 MG PO TABS
ORAL_TABLET | ORAL | 0 refills | Status: DC
Start: 2020-08-28 — End: 2021-01-12

## 2020-08-28 MED ORDER — VENLAFAXINE HCL ER 150 MG PO CP24
150.0000 mg | ORAL_CAPSULE | Freq: Every day | ORAL | 1 refills | Status: DC
Start: 2020-08-28 — End: 2020-11-29

## 2020-08-28 MED ORDER — PREDNISONE 20 MG PO TABS: 20.0000 mg | ORAL_TABLET | Freq: Two times a day (BID) | ORAL | 0 refills | Status: AC

## 2020-08-28 NOTE — Progress Notes (Signed)
Established Patient Office Visit  Subjective:  Patient ID: Seleni Meller, female    DOB: 01/04/1996  Age: 25 y.o. MRN: [id number]  CC:  Chief Complaint  Patient presents with  . Follow-up    F/u anxiety/depression.  Med refills.  Co having some migraines off/on 1 month.  She has been taking Ibuprofen or Tylenol.     HPI Anayia Eugene presents for follow-up of anxiety with depression.  Symptoms seem to be lifting with Effexor.  She is tolerating the drug well.  Sometimes forgets to take the second pill.  Overall it is working well for her.  She has been having headaches over the last month.  They can start in either temporal area and move up the back of her neck and to the back of her head.  There are no prodromal symptoms.  There is no nausea.  Light does bother the headaches.  She has no headache history.  Headaches are nonprogressive.  They come 2-3 times weekly and can last throughout the day.  She takes ibuprofen with relief.  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  Her dentist is given her mouthguard which does not seem to be helping.  She has been told that she tends to hold a lot of tension in her face.  Her teeth have flattened as a result of the bruxism.  Some of the headaches have been associated with a spot in front of her eye and blurred vision.  These probably clear after the headache passes.  68-1/2-year-old son is doing well.  She lives at home with her son and the baby's father.  Things seem to be going well there.  She is hoping to find a job where she can work from home.  Past Medical History:  Diagnosis Date  . Depression   . Diabetes mellitus without complication (HCC)    GDM  . Diet controlled gestational diabetes mellitus (GDM) in third trimester 03/07/2019   Some elevated glucose values on 1/5 but pt taking some values at 1 hour, not 2.  Reeval in 2 weeks.  . Gestational diabetes   . Supervision of normal first pregnancy, antepartum 10/13/2018   BABYSCRIPTS PATIENT: [x  ]Initial [x ]12 [ ] 20 [ ] 28 [ ] 32 [ ] 36 [ ] 38 [ ] 39 [ ] 40  Nursing Staff Provider Office Location  [facility]  Dating  LMP Language  English Anatomy  EIF, bilat CPC EFW 43%tile at 34 weeks (5lbs 2 oz) Flu Vaccine  03-02-19 Genetic Screen  NIPS: low risk  AFP:      TDaP vaccine  03-02-19 Hgb A1C or  GTT Early 5.0 Third trimester   Ref. Range 03/02/2019 08:58 Glucose, 1 hour Late    Past Surgical History:  Procedure Laterality Date  . NO PAST SURGERIES      Family History  Problem Relation Age of Onset  . Heart disease Maternal Grandmother   . Heart disease Paternal Grandfather     Social History   Socioeconomic History  . Marital status: Single    Spouse name: Not on file  . Number of children: Not on file  . Years of education: Not on file  . Highest education level: Not on file  Occupational History  . Not on file  Tobacco Use  . Smoking status: Former  . Smokeless tobacco: Never Used  Vaping Use  . Vaping Use: Never used  Substance and Sexual Activity  . Alcohol use: Not Currently  . Drug use: Not Currently  Types: Marijuana    Comment: last used prior to pregnancy  . Sexual activity: Yes    Birth control/protection: None  Other Topics Concern  . Not on file  Social History Narrative  . Not on file   Social Determinants of Health   Financial Resource Strain: Not on file  Food Insecurity: Not on file  Transportation Needs: Not on file  Physical Activity: Not on file  Stress: Not on file  Social Connections: Not on file  Intimate Partner Violence: Not on file    Outpatient Medications Prior to Visit  Medication Sig Dispense Refill  . ibuprofen (ADVIL) 600 MG tablet Take 1 tablet (600 mg total) by mouth every 6 (six) hours. 30 tablet 0  . Multiple Vitamin (MULTIVITAMIN) tablet Take 1 tablet by mouth daily.    . norgestimate-ethinyl estradiol (ORTHO-CYCLEN) 0.25-35 MG-MCG tablet Take 1 tablet by mouth daily. 28 tablet 11  . venlafaxine XR (EFFEXOR-XR) 75 MG  24 hr capsule TAKE 1 CAPSULE BY MOUTH DAILY WITH BREAKFAST FOR 1 WEEK. INCREASE TO 1 CAPSULE 2 TIMES DAILY 60 capsule 1  . hydrocortisone (ANUSOL-HC) 2.5 % rectal cream Place 1 application rectally 2 (two) times daily. (Patient not taking: No sig reported) 30 g 2  . metroNIDAZOLE (FLAGYL) 500 MG tablet Take 1 tablet (500 mg total) by mouth 2 (two) times daily. (Patient not taking: No sig reported) 14 tablet 2  . Prenatal Vit-Fe Fumarate-FA (PRENATAL MULTIVITAMIN) TABS tablet Take 1 tablet by mouth daily at 12 noon. (Patient not taking: No sig reported)     No facility-administered medications prior to visit.    No Known Allergies  ROS Review of Systems  Constitutional: Negative.   HENT: Negative.   Eyes: Positive for visual disturbance.  Respiratory: Negative.   Cardiovascular: Negative.   Gastrointestinal: Negative.  Negative for nausea and vomiting.  Genitourinary: Negative.   Skin: Negative for pallor and rash.  Neurological: Positive for headaches. Negative for speech difficulty, weakness and numbness.  Psychiatric/Behavioral: Positive for dysphoric mood.       [facility] 08/28/2020 01/10/2020 10/12/2019  Decreased Interest 1 2 1   Down, Depressed, Hopeless 0 1 1  PHQ - 2 Score 1 3 2   Altered sleeping 2 1 2   Tired, decreased energy 2 1 2   Change in appetite 0 2 1  Feeling bad or failure about yourself  0 1 0  Trouble concentrating 0 0 0  Moving slowly or fidgety/restless 0 0 1  Suicidal thoughts 0 1 0  PHQ-9 Score 5 9 8   Difficult doing work/chores Somewhat difficult Not difficult at all Somewhat difficult  Some recent data might be hidden    Objective:    Physical Exam Vitals and nursing note reviewed.  Constitutional:      General: She is not in acute distress.    Appearance: Normal appearance. She is not ill-appearing, toxic-appearing or diaphoretic.  HENT:     Head: Normocephalic and atraumatic.     Right Ear: Tympanic membrane, ear canal and  external ear normal.     Left Ear: Tympanic membrane, ear canal and external ear normal.     Mouth/Throat:     Mouth: Mucous membranes are moist.     Pharynx: Oropharynx is clear. No oropharyngeal exudate or posterior oropharyngeal erythema.  Eyes:     General: No scleral icterus.       Right eye: No discharge.        Left eye: No discharge.  Extraocular Movements: Extraocular movements intact.     Conjunctiva/sclera: Conjunctivae normal.     Pupils: Pupils are equal, round, and reactive to light.  Cardiovascular:     Rate and Rhythm: Normal rate and regular rhythm.  Pulmonary:     Effort: Pulmonary effort is normal.     Breath sounds: Normal breath sounds.  Abdominal:     General: Bowel sounds are normal.  Musculoskeletal:     Cervical back: No rigidity or tenderness.  Lymphadenopathy:     Cervical: No cervical adenopathy.  Skin:    General: Skin is warm and dry.  Neurological:     Mental Status: She is alert and oriented to person, place, and time.     Cranial Nerves: No cranial nerve deficit.  Psychiatric:        Mood and Affect: Mood normal.        Behavior: Behavior normal.     BP 120/78   Pulse (!) 106   Temp 97.6 F (36.4 C) (Temporal)   Ht 5\' 2"  (1.575 m)   Wt 222 lb 6.4 oz (100.9 kg)   SpO2 98%   BMI 40.68 kg/m  Wt Readings from Last 3 Encounters:  08/28/20 222 lb 6.4 oz (100.9 kg)  05/19/20 230 lb (104.3 kg)  02/02/20 222 lb (100.7 kg)     Health Maintenance Due  Topic Date Due  . HPV VACCINES (1 - 2-dose series) Never done  . Hepatitis C Screening  Never done       Topic Date Due  . HPV VACCINES (1 - 2-dose series) Never done    Lab Results  Component Value Date   TSH 0.86 07/07/2019   Lab Results  Component Value Date   WBC 7.7 10/12/2019   HGB 13.3 10/12/2019   HCT 40.1 10/12/2019   MCV 87.4 10/12/2019   PLT 240.0 10/12/2019   Lab Results  Component Value Date   NA 138 10/12/2019   K 4.2 10/12/2019   CO2 26 10/12/2019    GLUCOSE 98 10/12/2019   BUN 9 10/12/2019   CREATININE 0.67 10/12/2019   BILITOT 1.0 05/08/2018   AST 11 05/08/2018   ALT 13 05/08/2018   PROT 6.9 05/08/2018   CALCIUM 8.6 10/12/2019   GFR 109.76 10/12/2019   Lab Results  Component Value Date   CHOL 263 (H) 05/08/2018   Lab Results  Component Value Date   HDL 55 05/08/2018   Lab Results  Component Value Date   LDLCALC 177 (H) 05/08/2018   Lab Results  Component Value Date   TRIG 160 (H) 05/08/2018   Lab Results  Component Value Date   CHOLHDL 4.8 05/08/2018   Lab Results  Component Value Date   HGBA1C 5.2 07/07/2019      Assessment & Plan:   Problem List Items Addressed This Visit      Digestive   Bruxism   Relevant Medications   predniSONE (DELTASONE) 20 MG tablet   methocarbamol (ROBAXIN) 500 MG tablet     Other   Depression with anxiety - Primary   Relevant Medications   venlafaxine XR (EFFEXOR XR) 150 MG 24 hr capsule   Tension headache   Relevant Medications   predniSONE (DELTASONE) 20 MG tablet   methocarbamol (ROBAXIN) 500 MG tablet   venlafaxine XR (EFFEXOR XR) 150 MG 24 hr capsule      Meds ordered this encounter  Medications  . predniSONE (DELTASONE) 20 MG tablet    Sig: Take 1 tablet (20  mg total) by mouth 2 (two) times daily with a meal for 7 days.    Dispense:  14 tablet    Refill:  0  . methocarbamol (ROBAXIN) 500 MG tablet    Sig: Take one nightly for 2 weeks and then as needed.    Dispense:  30 tablet    Refill:  0  . venlafaxine XR (EFFEXOR XR) 150 MG 24 hr capsule    Sig: Take 1 capsule (150 mg total) by mouth daily with breakfast.    Dispense:  90 capsule    Refill:  1    Follow-up: Return follow up 1-2 months if head aches don't improve. Otherwise in 3 months..  Will use a brief course of prednisone 20 twice daily to cover for rebound headache.  She will take Robaxin at night before bedtime to help with pain and bruxism.  Hopefully this will going to help her headaches as  well.  Follow-up in 1 to 2 months if her headaches do not improve otherwise in 3 months.  We will continue venlafaxine 150 mg daily.  Mliss Sax, MD

## 2020-08-28 NOTE — Patient Instructions (Signed)
Tension Headache, Adult A tension headache is a feeling of pain, pressure, or aching over the front and sides of the head. The pain can be dull, or it can feel tight. There are two types of tension headache:  Episodic tension headache. This is when the headaches happen fewer than 15 days a month.  Chronic tension headache. This is when the headaches happen more than 15 days a month during a 3-month period. A tension headache can last from 30 minutes to several days. It is the most common kind of headache. Tension headaches are not normally associated with nausea or vomiting, and they do not get worse with physical activity. What are the causes? The exact cause of this condition is not known. Tension headaches are often triggered by stress, anxiety, or depression. Other triggers may include:  Alcohol.  Too much caffeine or caffeine withdrawal.  Respiratory infections, such as colds, flu, or sinus infections.  Dental problems or teeth clenching.  Fatigue.  Holding your head and neck in the same position for a long period of time, such as while using a computer.  Smoking.  Arthritis of the neck. What are the signs or symptoms? Symptoms of this condition include:  A feeling of pressure or tightness around the head.  Dull, aching head pain.  Pain over the front and sides of the head.  Tenderness in the muscles of the head, neck, and shoulders. How is this diagnosed? This condition may be diagnosed based on your symptoms, your medical history, and a physical exam. If your symptoms are severe or unusual, you may have imaging tests, such as a CT scan or an MRI of your head. Your vision may also be checked. How is this treated? This condition may be treated with lifestyle changes and with medicines that help relieve symptoms. Follow these instructions at home: Managing pain  Take over-the-counter and prescription medicines only as told by your health care provider.  When you have  a headache, lie down in a dark, quiet room.  If directed, put ice on your head and neck. To do this: ? Put ice in a plastic bag. ? Place a towel between your skin and the bag. ? Leave the ice on for 20 minutes, 2-3 times a day. ? Remove the ice if your skin turns bright red. This is very important. If you cannot feel pain, heat, or cold, you have a greater risk of damage to the area.  If directed, apply heat to the back of your neck as often as told by your health care provider. Use the heat source that your health care provider recommends, such as a moist heat pack or a heating pad. ? Place a towel between your skin and the heat source. ? Leave the heat on for 20-30 minutes. ? Remove the heat if your skin turns bright red. This is especially important if you are unable to feel pain, heat, or cold. You have a greater risk of getting burned. Eating and drinking  Eat meals on a regular schedule.  If you drink alcohol: ? Limit how much you have to:  0-1 drink a day for women who are not pregnant.  0-2 drinks a day for men. ? Know how much alcohol is in your drink. In the [place] one drink equals one 12 oz bottle of beer (355 mL), one 5 oz glass of wine (148 mL), or one 1 oz glass of hard liquor (44 mL).  Drink enough fluid to keep your urine   pale yellow.  Decrease your caffeine intake, or stop using caffeine. Lifestyle  Get 7-9 hours of sleep each night, or get the amount of sleep recommended by your health care provider.  At bedtime, remove computers, phones, and tablets from your room.  Find ways to manage your stress. This may include: ? Exercise. ? Deep breathing exercises. ? Yoga. ? Listening to music. ? Positive mental imagery.  Try to sit up straight and avoid tensing your muscles.  Do not use any products that contain nicotine or tobacco. These include cigarettes, chewing tobacco, and vaping devices, such as e-cigarettes. If you need help quitting, ask your health care  provider. General instructions  Avoid any headache triggers. Keep a journal to help find out what may trigger your headaches. For example, write down: ? What you eat and drink. ? How much sleep you get. ? Any change to your diet or medicines.  Keep all follow-up visits. This is important.   Contact a health care provider if:  Your headache does not get better.  Your headache comes back.  You are sensitive to sounds, light, or smells because of a headache.  You have nausea or you vomit.  Your stomach hurts. Get help right away if:  You suddenly develop a severe headache, along with any of the following: ? A stiff neck. ? Nausea and vomiting. ? Confusion. ? Weakness in one part or one side of your body. ? Double vision or loss of vision. ? Shortness of breath. ? Rash. ? Unusual sleepiness. ? Fever or chills. ? Trouble speaking. ? Pain in your eye or ear. ? Trouble walking or balancing. ? Feeling faint or passing out. Summary  A tension headache is a feeling of pain, pressure, or aching over the front and sides of the head.  A tension headache can last from 30 minutes to several days. It is the most common kind of headache.  This condition may be diagnosed based on your symptoms, your medical history, and a physical exam.  This condition may be treated with lifestyle changes and with medicines that help relieve symptoms. This information is not intended to replace advice given to you by your health care provider. Make sure you discuss any questions you have with your health care provider. Document Revised: 12/09/2019 Document Reviewed: 12/09/2019 Elsevier Patient Education  2021 [employer] Inc[employer]  

## 2020-09-19 ENCOUNTER — Encounter: Payer: Self-pay | Admitting: Family Medicine

## 2020-09-19 ENCOUNTER — Ambulatory Visit: Payer: Medicaid Other | Admitting: Family Medicine

## 2020-09-19 ENCOUNTER — Other Ambulatory Visit: Payer: Self-pay

## 2020-09-19 VITALS — BP 118/78 | HR 102 | Temp 98.9°F | Ht 62.0 in | Wt 223.6 lb

## 2020-09-19 DIAGNOSIS — G4459 Other complicated headache syndrome: Secondary | ICD-10-CM | POA: Diagnosis not present

## 2020-09-19 MED ORDER — SUMATRIPTAN SUCCINATE 50 MG PO TABS: ORAL_TABLET | ORAL | 0 refills | Status: DC

## 2020-09-19 MED ORDER — PREDNISONE 10 MG (21) PO TBPK: ORAL_TABLET | ORAL | 0 refills | Status: DC

## 2020-09-19 NOTE — Progress Notes (Signed)
Established Patient Office Visit  Subjective:  Patient ID: Tricia Solomon, female    DOB: Sep 30, 1995  Age: 25 y.o. MRN: [id number]  CC:  Chief Complaint  Patient presents with   Follow-up    Migraine headache    HPI Tricia Solomon presents for follow-up of her headache.  See also previous note.  Again headaches have been present since parturition of her last pregnancy.  Her son is 16 months and doing well.  She now admits to some prodromal symptoms with scotomata.  She is sensitive to the light.  There is some nausea.  She had done well with the prednisone and the headache stopped but then seemed to return shortly after she finished it.  She has no family history migraine headaches.  She did not have headaches as a child.  Of concern with one of her last headache she developed numbness in her entire left arm.  There was no weakness.  There was no dysphagia.  Headaches are occurring up to 2-3 times a week.  They may last all day.  Past Medical History:  Diagnosis Date   Depression    Diabetes mellitus without complication (HCC)    GDM   Diet controlled gestational diabetes mellitus (GDM) in third trimester 03/07/2019   Some elevated glucose values on 1/5 but pt taking some values at 1 hour, not 2.  Reeval in 2 weeks.   Gestational diabetes    Supervision of normal first pregnancy, antepartum 10/13/2018   BABYSCRIPTS PATIENT: [x ]Initial [x ]12 [ ] 20 [ ] 28 [ ] 32 [ ] 36 [ ] 38 [ ] 39 [ ] 40  Nursing Staff Provider Office Location  [facility]  Dating  LMP Language  English Anatomy  EIF, bilat CPC EFW 43%tile at 34 weeks (5lbs 2 oz) Flu Vaccine  03-02-19 Genetic Screen  NIPS: low risk  AFP:      TDaP vaccine  03-02-19 Hgb A1C or  GTT Early 5.0 Third trimester   Ref. Range 03/02/2019 08:58 Glucose, 1 hour Late    Past Surgical History:  Procedure Laterality Date   NO PAST SURGERIES      Family History  Problem Relation Age of Onset   Heart disease Maternal Grandmother    Heart disease Paternal  Grandfather     Social History   Socioeconomic History   Marital status: Single    Spouse name: Not on file   Number of children: Not on file   Years of education: Not on file   Highest education level: Not on file  Occupational History   Not on file  Tobacco Use   Smoking status: Former    Pack years: 0.00   Smokeless tobacco: Never  Vaping Use   Vaping Use: Never used  Substance and Sexual Activity   Alcohol use: Not Currently   Drug use: Not Currently    Types: Marijuana    Comment: last used prior to pregnancy   Sexual activity: Yes    Birth control/protection: None  Other Topics Concern   Not on file  Social History Narrative   Not on file   Social Determinants of Health   Financial Resource Strain: Not on file  Food Insecurity: Not on file  Transportation Needs: Not on file  Physical Activity: Not on file  Stress: Not on file  Social Connections: Not on file  Intimate Partner Violence: Not on file    Outpatient Medications Prior to Visit  Medication Sig Dispense Refill   ibuprofen (ADVIL) 600 MG tablet  Take 1 tablet (600 mg total) by mouth every 6 (six) hours. 30 tablet 0   Multiple Vitamin (MULTIVITAMIN) tablet Take 1 tablet by mouth daily.     norgestimate-ethinyl estradiol (ORTHO-CYCLEN) 0.25-35 MG-MCG tablet Take 1 tablet by mouth daily. 28 tablet 11   venlafaxine XR (EFFEXOR XR) 150 MG 24 hr capsule Take 1 capsule (150 mg total) by mouth daily with breakfast. 90 capsule 1   methocarbamol (ROBAXIN) 500 MG tablet Take one nightly for 2 weeks and then as needed. (Patient not taking: Reported on 09/19/2020) 30 tablet 0   hydrocortisone (ANUSOL-HC) 2.5 % rectal cream Place 1 application rectally 2 (two) times daily. (Patient not taking: No sig reported) 30 g 2   metroNIDAZOLE (FLAGYL) 500 MG tablet Take 1 tablet (500 mg total) by mouth 2 (two) times daily. (Patient not taking: No sig reported) 14 tablet 2   Multiple Vitamin (MULTIVITAMIN) tablet Take 1 tablet  by mouth daily.     Prenatal Vit-Fe Fumarate-FA (PRENATAL MULTIVITAMIN) TABS tablet Take 1 tablet by mouth daily at 12 noon. (Patient not taking: No sig reported)     No facility-administered medications prior to visit.    No Known Allergies  ROS Review of Systems  Constitutional: Negative.   Neurological:  Positive for numbness and headaches. Negative for facial asymmetry, speech difficulty, weakness and light-headedness.     Objective:    Physical Exam Vitals and nursing note reviewed.  Constitutional:      General: She is not in acute distress.    Appearance: Normal appearance. She is not ill-appearing, toxic-appearing or diaphoretic.  HENT:     Head: Normocephalic and atraumatic.     Right Ear: External ear normal.     Left Ear: External ear normal.     Mouth/Throat:     Mouth: Mucous membranes are moist.     Pharynx: Oropharynx is clear. No oropharyngeal exudate or posterior oropharyngeal erythema.  Eyes:     General: No scleral icterus.       Right eye: No discharge.        Left eye: No discharge.     Extraocular Movements: Extraocular movements intact.     Conjunctiva/sclera: Conjunctivae normal.     Pupils: Pupils are equal, round, and reactive to light.  Cardiovascular:     Rate and Rhythm: Normal rate and regular rhythm.  Pulmonary:     Effort: Pulmonary effort is normal.     Breath sounds: Normal breath sounds.  Musculoskeletal:     Cervical back: No rigidity or tenderness.  Lymphadenopathy:     Cervical: No cervical adenopathy.  Skin:    General: Skin is warm and dry.  Neurological:     Mental Status: She is alert and oriented to person, place, and time.  Psychiatric:        Mood and Affect: Mood normal.        Behavior: Behavior normal.    BP 118/78   Pulse (!) 102   Temp 98.9 F (37.2 C)   Ht 5\' 2"  (1.575 m)   Wt 223 lb 9.6 oz (101.4 kg)   SpO2 97%   BMI 40.90 kg/m  Wt Readings from Last 3 Encounters:  09/19/20 223 lb 9.6 oz (101.4 kg)   08/28/20 222 lb 6.4 oz (100.9 kg)  05/19/20 230 lb (104.3 kg)     Health Maintenance Due  Topic Date Due   HPV VACCINES (1 - 2-dose series) Never done   Hepatitis C Screening  Never done  Topic Date Due   HPV VACCINES (1 - 2-dose series) Never done    Lab Results  Component Value Date   TSH 0.86 07/07/2019   Lab Results  Component Value Date   WBC 7.7 10/12/2019   HGB 13.3 10/12/2019   HCT 40.1 10/12/2019   MCV 87.4 10/12/2019   PLT 240.0 10/12/2019   Lab Results  Component Value Date   NA 138 10/12/2019   K 4.2 10/12/2019   CO2 26 10/12/2019   GLUCOSE 98 10/12/2019   BUN 9 10/12/2019   CREATININE 0.67 10/12/2019   BILITOT 1.0 05/08/2018   AST 11 05/08/2018   ALT 13 05/08/2018   PROT 6.9 05/08/2018   CALCIUM 8.6 10/12/2019   GFR 109.76 10/12/2019   Lab Results  Component Value Date   CHOL 263 (H) 05/08/2018   Lab Results  Component Value Date   HDL 55 05/08/2018   Lab Results  Component Value Date   LDLCALC 177 (H) 05/08/2018   Lab Results  Component Value Date   TRIG 160 (H) 05/08/2018   Lab Results  Component Value Date   CHOLHDL 4.8 05/08/2018   Lab Results  Component Value Date   HGBA1C 5.2 07/07/2019      Assessment & Plan:   Problem List Items Addressed This Visit   None Visit Diagnoses     Other complicated headache syndrome    -  Primary   Relevant Medications   SUMAtriptan (IMITREX) 50 MG tablet   predniSONE (STERAPRED UNI-PAK 21 TAB) 10 MG (21) TBPK tablet   Other Relevant Orders   MR Brain W Wo Contrast       Meds ordered this encounter  Medications   SUMAtriptan (IMITREX) 50 MG tablet    Sig: Take one tablet at first sign of headache. May repeat once in 2 hours if not relieved with first dose.    Dispense:  10 tablet    Refill:  0   predniSONE (STERAPRED UNI-PAK 21 TAB) 10 MG (21) TBPK tablet    Sig: Take 6 today, 5 tomorrow, 4 the next day and then 3, 2, 1 and stop    Dispense:  21 tablet    Refill:  0     Follow-up: Return in about 1 month (around 10/19/2020).   Will try Imitrex and another round of prednisone.  May consider prophylaxis with Topamax with normal MRI of brain. Mliss Sax, MD

## 2020-09-30 ENCOUNTER — Ambulatory Visit (HOSPITAL_BASED_OUTPATIENT_CLINIC_OR_DEPARTMENT_OTHER)
Admission: RE | Admit: 2020-09-30 | Discharge: 2020-09-30 | Disposition: A | Discharge status: Home / Self Care | Payer: Medicaid Other | Source: Ambulatory Visit | Attending: Family Medicine | Admitting: Family Medicine

## 2020-09-30 ENCOUNTER — Other Ambulatory Visit: Payer: Self-pay

## 2020-09-30 DIAGNOSIS — G4459 Other complicated headache syndrome: Secondary | ICD-10-CM | POA: Diagnosis not present

## 2020-09-30 DIAGNOSIS — R519 Headache, unspecified: Secondary | ICD-10-CM | POA: Diagnosis not present

## 2020-09-30 MED ORDER — GADOBUTROL 1 MMOL/ML IV SOLN
10.0000 mL | Freq: Once | INTRAVENOUS | Status: AC | PRN
  Administered 2020-09-30: 10 mL via INTRAVENOUS

## 2020-10-18 ENCOUNTER — Other Ambulatory Visit: Payer: Self-pay

## 2020-10-19 ENCOUNTER — Encounter: Payer: Self-pay | Admitting: Family Medicine

## 2020-10-19 ENCOUNTER — Ambulatory Visit: Payer: Medicaid Other | Admitting: Family Medicine

## 2020-10-19 VITALS — BP 124/74 | HR 113 | Temp 97.5°F | Ht 62.0 in | Wt 226.4 lb

## 2020-10-19 DIAGNOSIS — E611 Iron deficiency: Secondary | ICD-10-CM

## 2020-10-19 DIAGNOSIS — F418 Other specified anxiety disorders: Secondary | ICD-10-CM

## 2020-10-19 DIAGNOSIS — N63 Unspecified lump in unspecified breast: Secondary | ICD-10-CM | POA: Diagnosis not present

## 2020-10-19 DIAGNOSIS — G44209 Tension-type headache, unspecified, not intractable: Secondary | ICD-10-CM

## 2020-10-19 MED ORDER — FERROUS SULFATE 300 (60 FE) MG/5ML PO SYRP: 300.0000 mg | ORAL_SOLUTION | Freq: Every day | ORAL | 3 refills | Status: DC

## 2020-10-19 NOTE — Progress Notes (Signed)
Established Patient Office Visit  Subjective:  Patient ID: Tricia Solomon, female    DOB: 1995/05/20  Age: 25 y.o. MRN: [id number]  CC:  Chief Complaint  Patient presents with   Follow-up    1 month follow up on MRI of head and headaches. Per patient headaches are better. Concerns about soreness at left breast and armpit x 2 weeks.     HPI Tricia Solomon presents for follow-up of headaches, anxiety with depression and iron deficiency.  She is taking a multivitamin with iron.  Hemoglobin is stable and normal.  Effexor continues to work well for her.  Imitrex did help her headache.  At this point she is only using it a couple times a month.  She has soreness in the question of a mass left lateral breast.  She is no longer breast-feeding.  Past Medical History:  Diagnosis Date   Depression    Diabetes mellitus without complication (HCC)    GDM   Diet controlled gestational diabetes mellitus (GDM) in third trimester 03/07/2019   Some elevated glucose values on 1/5 but pt taking some values at 1 hour, not 2.  Reeval in 2 weeks.   Gestational diabetes    Supervision of normal first pregnancy, antepartum 10/13/2018   BABYSCRIPTS PATIENT: [x ]Initial [x ]12 [ ] 20 [ ] 28 [ ] 32 [ ] 36 [ ] 38 [ ] 39 [ ] 40  Nursing Staff Provider Office Location  [facility]  Dating  LMP Language  English Anatomy  EIF, bilat CPC EFW 43%tile at 34 weeks (5lbs 2 oz) Flu Vaccine  03-02-19 Genetic Screen  NIPS: low risk  AFP:      TDaP vaccine  03-02-19 Hgb A1C or  GTT Early 5.0 Third trimester   Ref. Range 03/02/2019 08:58 Glucose, 1 hour Late    Past Surgical History:  Procedure Laterality Date   NO PAST SURGERIES      Family History  Problem Relation Age of Onset   Heart disease Maternal Grandmother    Heart disease Paternal Grandfather     Social History   Socioeconomic History   Marital status: Single    Spouse name: Not on file   Number of children: Not on file   Years of education: Not on file   Highest  education level: Not on file  Occupational History   Not on file  Tobacco Use   Smoking status: Former   Smokeless tobacco: Never  Vaping Use   Vaping Use: Never used  Substance and Sexual Activity   Alcohol use: Not Currently   Drug use: Not Currently    Types: Marijuana    Comment: last used prior to pregnancy   Sexual activity: Yes    Birth control/protection: None  Other Topics Concern   Not on file  Social History Narrative   Not on file   Social Determinants of Health   Financial Resource Strain: Not on file  Food Insecurity: Not on file  Transportation Needs: Not on file  Physical Activity: Not on file  Stress: Not on file  Social Connections: Not on file  Intimate Partner Violence: Not on file    Outpatient Medications Prior to Visit  Medication Sig Dispense Refill   ibuprofen (ADVIL) 600 MG tablet Take 1 tablet (600 mg total) by mouth every 6 (six) hours. 30 tablet 0   Multiple Vitamin (MULTIVITAMIN) tablet Take 1 tablet by mouth daily.     norgestimate-ethinyl estradiol (ORTHO-CYCLEN) 0.25-35 MG-MCG tablet Take 1 tablet by mouth daily. 28 tablet  11   SUMAtriptan (IMITREX) 50 MG tablet Take one tablet at first sign of headache. May repeat once in 2 hours if not relieved with first dose. 10 tablet 0   venlafaxine XR (EFFEXOR XR) 150 MG 24 hr capsule Take 1 capsule (150 mg total) by mouth daily with breakfast. 90 capsule 1   methocarbamol (ROBAXIN) 500 MG tablet Take one nightly for 2 weeks and then as needed. (Patient not taking: No sig reported) 30 tablet 0   predniSONE (STERAPRED UNI-PAK 21 TAB) 10 MG (21) TBPK tablet Take 6 today, 5 tomorrow, 4 the next day and then 3, 2, 1 and stop (Patient not taking: Reported on 10/19/2020) 21 tablet 0   No facility-administered medications prior to visit.    No Known Allergies  ROS Review of Systems  Constitutional: Negative.   Respiratory: Negative.    Cardiovascular: Negative.   Gastrointestinal: Negative.    Neurological:  Positive for headaches.  Psychiatric/Behavioral:  Negative for dysphoric mood. The patient is not nervous/anxious.      Objective:    Physical Exam Vitals and nursing note reviewed.  Constitutional:      General: She is not in acute distress.    Appearance: Normal appearance. She is not ill-appearing, toxic-appearing or diaphoretic.  HENT:     Head: Normocephalic and atraumatic.     Right Ear: External ear normal.     Left Ear: External ear normal.  Eyes:     General: No scleral icterus.       Right eye: No discharge.        Left eye: No discharge.     Extraocular Movements: Extraocular movements intact.     Conjunctiva/sclera: Conjunctivae normal.  Cardiovascular:     Rate and Rhythm: Normal rate and regular rhythm.  Pulmonary:     Effort: Pulmonary effort is normal.     Breath sounds: Normal breath sounds.  Chest:  Breasts:    Right: No swelling, bleeding, inverted nipple, mass, nipple discharge, skin change or tenderness.     Left: Mass present. No swelling, bleeding, inverted nipple, nipple discharge, skin change or tenderness.    Musculoskeletal:     Cervical back: No rigidity or tenderness.  Lymphadenopathy:     Cervical: No cervical adenopathy.  Skin:    General: Skin is warm and dry.  Neurological:     Mental Status: She is alert and oriented to person, place, and time.  Psychiatric:        Mood and Affect: Mood normal.        Behavior: Behavior normal.    BP 124/74 (BP Location: Left Arm, Patient Position: Sitting, Cuff Size: Large)   Pulse (!) 113   Temp (!) 97.5 F (36.4 C) (Temporal)   Ht 5\' 2"  (1.575 m)   Wt 226 lb 6.4 oz (102.7 kg)   SpO2 97%   BMI 41.41 kg/m  Wt Readings from Last 3 Encounters:  10/19/20 226 lb 6.4 oz (102.7 kg)  09/19/20 223 lb 9.6 oz (101.4 kg)  08/28/20 222 lb 6.4 oz (100.9 kg)     Health Maintenance Due  Topic Date Due   HPV VACCINES (1 - 2-dose series) Never done   Hepatitis C Screening  Never done        Topic Date Due   HPV VACCINES (1 - 2-dose series) Never done    Lab Results  Component Value Date   TSH 0.86 07/07/2019   Lab Results  Component Value Date   WBC  7.7 10/12/2019   HGB 13.3 10/12/2019   HCT 40.1 10/12/2019   MCV 87.4 10/12/2019   PLT 240.0 10/12/2019   Lab Results  Component Value Date   NA 138 10/12/2019   K 4.2 10/12/2019   CO2 26 10/12/2019   GLUCOSE 98 10/12/2019   BUN 9 10/12/2019   CREATININE 0.67 10/12/2019   BILITOT 1.0 05/08/2018   AST 11 05/08/2018   ALT 13 05/08/2018   PROT 6.9 05/08/2018   CALCIUM 8.6 10/12/2019   GFR 109.76 10/12/2019   Lab Results  Component Value Date   CHOL 263 (H) 05/08/2018   Lab Results  Component Value Date   HDL 55 05/08/2018   Lab Results  Component Value Date   LDLCALC 177 (H) 05/08/2018   Lab Results  Component Value Date   TRIG 160 (H) 05/08/2018   Lab Results  Component Value Date   CHOLHDL 4.8 05/08/2018   Lab Results  Component Value Date   HGBA1C 5.2 07/07/2019      Assessment & Plan:   Problem List Items Addressed This Visit       Other   Depression with anxiety - Primary   Iron deficiency   Relevant Medications   ferrous sulfate 300 (60 Fe) MG/5ML syrup   Tension headache   Breast mass in female   Relevant Orders   MM Digital Screening   MM Digital Diagnostic Unilat L    Meds ordered this encounter  Medications   ferrous sulfate 300 (60 Fe) MG/5ML syrup    Sig: Take 5 mLs (300 mg total) by mouth daily with breakfast.    Dispense:  150 mL    Refill:  3    Follow-up: Return follow up in September for scheduled appt..  Continue multivitamin with iron and 1 teaspoon of iron circumflex.  Continue Effexor for anxiety and depression.  We will use Imitrex as needed for headache.  We will send for screening mammogram with diagnostic ultrasound of left breast mass.  Mliss Sax, MD

## 2020-10-20 ENCOUNTER — Other Ambulatory Visit: Payer: Self-pay | Admitting: Family Medicine

## 2020-10-20 DIAGNOSIS — N63 Unspecified lump in unspecified breast: Secondary | ICD-10-CM

## 2020-11-02 ENCOUNTER — Other Ambulatory Visit: Payer: Medicaid Other

## 2020-11-17 ENCOUNTER — Inpatient Hospital Stay: Admission: RE | Admit: 2020-11-17 | Payer: Medicaid Other | Source: Ambulatory Visit

## 2020-11-23 ENCOUNTER — Other Ambulatory Visit: Payer: Medicaid Other

## 2020-11-29 ENCOUNTER — Ambulatory Visit: Payer: Medicaid Other | Admitting: Family Medicine

## 2020-11-29 ENCOUNTER — Encounter: Payer: Self-pay | Admitting: Family Medicine

## 2020-11-29 ENCOUNTER — Other Ambulatory Visit: Payer: Self-pay

## 2020-11-29 VITALS — BP 124/70 | HR 107 | Temp 97.4°F | Ht 62.0 in | Wt 224.4 lb

## 2020-11-29 DIAGNOSIS — F418 Other specified anxiety disorders: Secondary | ICD-10-CM

## 2020-11-29 DIAGNOSIS — Z23 Encounter for immunization: Secondary | ICD-10-CM

## 2020-11-29 DIAGNOSIS — Z0000 Encounter for general adult medical examination without abnormal findings: Secondary | ICD-10-CM

## 2020-11-29 DIAGNOSIS — G44209 Tension-type headache, unspecified, not intractable: Secondary | ICD-10-CM | POA: Diagnosis not present

## 2020-11-29 DIAGNOSIS — F633 Trichotillomania: Secondary | ICD-10-CM | POA: Diagnosis not present

## 2020-11-29 MED ORDER — VENLAFAXINE HCL ER 75 MG PO CP24: 225.0000 mg | ORAL_CAPSULE | Freq: Every day | ORAL | 0 refills | Status: DC

## 2020-11-29 NOTE — Progress Notes (Signed)
Established Patient Office Visit  Subjective:  Patient ID: Tricia Solomon, female    DOB: 1995/06/29  Age: 25 y.o. MRN: [id number]  CC:  Chief Complaint  Patient presents with   Headache    Follow up on head aches, patient states that they have improved. Concerns about weight gain and she have become worse with plucking eye lash hair.      Zianne Schubring presents for follow-up of anxiety with depression, headaches.  Would like a flu vaccine today.  She is accompanied by her 48-year-old who has been doing quite well.  Child's been diagnosed with hypothyroidism.  Headaches have improved.  She is hardly had to use the Imitrex at all.  Still is feeling a good bit of anxiety with depression.  She is now having difficulty controlling her desire to pull her hair.  She tends to pluck 1 hair at a time.  She is plucked all of the hairs out of her right upper eyelid.  Is concerned about her weight.  Past Medical History:  Diagnosis Date   Depression    Diabetes mellitus without complication (HCC)    GDM   Diet controlled gestational diabetes mellitus (GDM) in third trimester 03/07/2019   Some elevated glucose values on 1/5 but pt taking some values at 1 hour, not 2.  Reeval in 2 weeks.   Gestational diabetes    Supervision of normal first pregnancy, antepartum 10/13/2018   BABYSCRIPTS PATIENT: [x ]Initial [x ]12 [ ] 20 [ ] 28 [ ] 32 [ ] 36 [ ] 38 [ ] 39 [ ] 40  Nursing Staff Provider Office Location  [facility]  Dating  LMP Language  English Anatomy  EIF, bilat CPC EFW 43%tile at 34 weeks (5lbs 2 oz) Flu Vaccine  03-02-19 Genetic Screen  NIPS: low risk  AFP:      TDaP vaccine  03-02-19 Hgb A1C or  GTT Early 5.0 Third trimester   Ref. Range 03/02/2019 08:58 Glucose, 1 hour Late    Past Surgical History:  Procedure Laterality Date   NO PAST SURGERIES      Family History  Problem Relation Age of Onset   Heart disease Maternal Grandmother    Heart disease Paternal Grandfather     Social History    Socioeconomic History   Marital status: Single    Spouse name: Not on file   Number of children: Not on file   Years of education: Not on file   Highest education level: Not on file  Occupational History   Not on file  Tobacco Use   Smoking status: Former   Smokeless tobacco: Never  Vaping Use   Vaping Use: Never used  Substance and Sexual Activity   Alcohol use: Not Currently   Drug use: Not Currently    Types: Marijuana    Comment: last used prior to pregnancy   Sexual activity: Yes    Birth control/protection: None  Other Topics Concern   Not on file  Social History Narrative   Not on file   Social Determinants of Health   Financial Resource Strain: Not on file  Food Insecurity: Not on file  Transportation Needs: Not on file  Physical Activity: Not on file  Stress: Not on file  Social Connections: Not on file  Intimate Partner Violence: Not on file    Outpatient Medications Prior to Visit  Medication Sig Dispense Refill   ibuprofen (ADVIL) 600 MG tablet Take 1 tablet (600 mg total) by mouth every 6 (six) hours. 30 tablet 0  Multiple Vitamin (MULTIVITAMIN) tablet Take 1 tablet by mouth daily.     norgestimate-ethinyl estradiol (ORTHO-CYCLEN) 0.25-35 MG-MCG tablet Take 1 tablet by mouth daily. 28 tablet 11   SUMAtriptan (IMITREX) 50 MG tablet Take one tablet at first sign of headache. May repeat once in 2 hours if not relieved with first dose. 10 tablet 0   ferrous sulfate 300 (60 Fe) MG/5ML syrup Take 5 mLs (300 mg total) by mouth daily with breakfast. 150 mL 3   venlafaxine XR (EFFEXOR XR) 150 MG 24 hr capsule Take 1 capsule (150 mg total) by mouth daily with breakfast. 90 capsule 1   methocarbamol (ROBAXIN) 500 MG tablet Take one nightly for 2 weeks and then as needed. (Patient not taking: No sig reported) 30 tablet 0   No facility-administered medications prior to visit.    No Known Allergies  ROS Review of Systems  Constitutional: Negative.   HENT:  Negative.    Eyes:  Negative for photophobia and visual disturbance.  Respiratory: Negative.    Cardiovascular: Negative.   Gastrointestinal: Negative.   Endocrine: Negative for polyphagia and polyuria.  Genitourinary: Negative.   Psychiatric/Behavioral:  Positive for behavioral problems and dysphoric mood. The patient is nervous/anxious.      Depression screen [facility] 2/9 11/29/2020 11/29/2020 10/19/2020  Decreased Interest 1 1 0  Down, Depressed, Hopeless 1 1 0  PHQ - 2 Score 2 2 0  Altered sleeping 2 - 0  Tired, decreased energy 2 - 0  Change in appetite 2 - 0  Feeling bad or failure about yourself  1 - 0  Trouble concentrating 1 - 0  Moving slowly or fidgety/restless 1 - 0  Suicidal thoughts 1 - 0  PHQ-9 Score 12 - 0  Difficult doing work/chores Very difficult - Not difficult at all  Some recent data might be hidden   GAD 7 : Generalized Anxiety Score 11/29/2020 10/19/2020 08/28/2020 01/10/2020  Nervous, Anxious, on Edge 1 0 0 0  Control/stop worrying 1 0 0 0  Worry too much - different things 1 0 0 1  Trouble relaxing 2 0 0 1  Restless 1 0 0 0  Easily annoyed or irritable 2 0 1 2  Afraid - awful might happen 1 0 0 0  Total GAD 7 Score 9 0 1 4  Anxiety Difficulty Somewhat difficult Not difficult at all Somewhat difficult Not difficult at all      Objective:    Physical Exam Vitals and nursing note reviewed.  Constitutional:      General: She is not in acute distress.    Appearance: She is well-developed. She is obese. She is not ill-appearing, toxic-appearing or diaphoretic.  HENT:     Head: Normocephalic and atraumatic.  Eyes:     General: No scleral icterus.    Extraocular Movements: Extraocular movements intact.     Pupils: Pupils are equal, round, and reactive to light. Pupils are equal.   Cardiovascular:     Rate and Rhythm: Normal rate and regular rhythm.  Pulmonary:     Effort: Pulmonary effort is normal.     Breath sounds: Normal breath sounds.  Musculoskeletal:      Cervical back: Normal range of motion and neck supple. No rigidity.  Lymphadenopathy:     Cervical: No cervical adenopathy.  Neurological:     Mental Status: She is alert and oriented to person, place, and time.  Psychiatric:        Mood and Affect: Mood normal.  Behavior: Behavior normal.    BP 124/70 (BP Location: Right Arm, Patient Position: Sitting, Cuff Size: Normal)   Pulse (!) 107   Temp (!) 97.4 F (36.3 C) (Temporal)   Ht 5\' 2"  (1.575 m)   Wt 224 lb 6.4 oz (101.8 kg)   SpO2 98%   BMI 41.04 kg/m  Wt Readings from Last 3 Encounters:  11/29/20 224 lb 6.4 oz (101.8 kg)  10/19/20 226 lb 6.4 oz (102.7 kg)  09/19/20 223 lb 9.6 oz (101.4 kg)     Health Maintenance Due  Topic Date Due   HPV VACCINES (1 - 2-dose series) Never done   Hepatitis C Screening  Never done       Topic Date Due   HPV VACCINES (1 - 2-dose series) Never done    Lab Results  Component Value Date   TSH 0.86 07/07/2019   Lab Results  Component Value Date   WBC 7.7 10/12/2019   HGB 13.3 10/12/2019   HCT 40.1 10/12/2019   MCV 87.4 10/12/2019   PLT 240.0 10/12/2019   Lab Results  Component Value Date   NA 138 10/12/2019   K 4.2 10/12/2019   CO2 26 10/12/2019   GLUCOSE 98 10/12/2019   BUN 9 10/12/2019   CREATININE 0.67 10/12/2019   BILITOT 1.0 05/08/2018   AST 11 05/08/2018   ALT 13 05/08/2018   PROT 6.9 05/08/2018   CALCIUM 8.6 10/12/2019   GFR 109.76 10/12/2019   Lab Results  Component Value Date   CHOL 263 (H) 05/08/2018   Lab Results  Component Value Date   HDL 55 05/08/2018   Lab Results  Component Value Date   LDLCALC 177 (H) 05/08/2018   Lab Results  Component Value Date   TRIG 160 (H) 05/08/2018   Lab Results  Component Value Date   CHOLHDL 4.8 05/08/2018   Lab Results  Component Value Date   HGBA1C 5.2 07/07/2019      Assessment & Plan:   Problem List Items Addressed This Visit       Other   Depression with anxiety   Relevant  Medications   venlafaxine XR (EFFEXOR-XR) 75 MG 24 hr capsule   Tension headache   Relevant Medications   venlafaxine XR (EFFEXOR-XR) 75 MG 24 hr capsule   Other Visit Diagnoses     Trichotillomania    -  Primary   Healthcare maintenance       Relevant Orders   Flu Vaccine QUAD 6+ mos PF IM (Fluarix Quad PF) (Completed)       Meds ordered this encounter  Medications   venlafaxine XR (EFFEXOR-XR) 75 MG 24 hr capsule    Sig: Take 3 capsules (225 mg total) by mouth daily with breakfast.    Dispense:  270 capsule    Refill:  0    Follow-up: Return in about 6 weeks (around 01/10/2021).  Information was given on managing anxiety and depression as well as impulse controls.  Suggested that she follow-up with her online counselor and she plans to do so.  We both agreed that the higher dose of Effexor may be helpful.  Flu vaccine today.  Follow-up in 6 weeks.  01/12/2021, MD

## 2020-12-17 ENCOUNTER — Other Ambulatory Visit: Payer: Self-pay | Admitting: Obstetrics

## 2020-12-17 DIAGNOSIS — Z30011 Encounter for initial prescription of contraceptive pills: Secondary | ICD-10-CM

## 2021-01-11 ENCOUNTER — Ambulatory Visit: Payer: Medicaid Other | Admitting: Family Medicine

## 2021-01-12 ENCOUNTER — Encounter: Payer: Self-pay | Admitting: Family Medicine

## 2021-01-12 ENCOUNTER — Other Ambulatory Visit: Payer: Self-pay

## 2021-01-12 ENCOUNTER — Ambulatory Visit: Payer: Medicaid Other | Admitting: Family Medicine

## 2021-01-12 VITALS — BP 114/78 | HR 97 | Temp 97.6°F | Ht 62.0 in | Wt 214.0 lb

## 2021-01-12 DIAGNOSIS — F633 Trichotillomania: Secondary | ICD-10-CM

## 2021-01-12 DIAGNOSIS — G44209 Tension-type headache, unspecified, not intractable: Secondary | ICD-10-CM

## 2021-01-12 DIAGNOSIS — E611 Iron deficiency: Secondary | ICD-10-CM | POA: Diagnosis not present

## 2021-01-12 DIAGNOSIS — F418 Other specified anxiety disorders: Secondary | ICD-10-CM

## 2021-01-12 DIAGNOSIS — Z30011 Encounter for initial prescription of contraceptive pills: Secondary | ICD-10-CM | POA: Diagnosis not present

## 2021-01-12 DIAGNOSIS — Z8632 Personal history of gestational diabetes: Secondary | ICD-10-CM | POA: Diagnosis not present

## 2021-01-12 DIAGNOSIS — Z0000 Encounter for general adult medical examination without abnormal findings: Secondary | ICD-10-CM

## 2021-01-12 DIAGNOSIS — G4459 Other complicated headache syndrome: Secondary | ICD-10-CM

## 2021-01-12 LAB — CBC
HCT: 45.4 % (ref 36.0–46.0)
Hemoglobin: 14.9 g/dL (ref 12.0–15.0)
MCHC: 32.8 g/dL (ref 30.0–36.0)
MCV: 94.4 fl (ref 78.0–100.0)
Platelets: 222 10*3/uL (ref 150.0–400.0)
RBC: 4.81 Mil/uL (ref 3.87–5.11)
RDW: 13.6 % (ref 11.5–15.5)
WBC: 8.1 10*3/uL (ref 4.0–10.5)

## 2021-01-12 LAB — BASIC METABOLIC PANEL
BUN: 23 mg/dL (ref 6–23)
CO2: 28 mEq/L (ref 19–32)
Calcium: 9.4 mg/dL (ref 8.4–10.5)
Chloride: 102 mEq/L (ref 96–112)
Creatinine, Ser: 0.88 mg/dL (ref 0.40–1.20)
GFR: 92.54 mL/min (ref >60.00)
Glucose, Bld: 101 mg/dL — ABNORMAL HIGH (ref 70–99)
Potassium: 4.5 mEq/L (ref 3.5–5.1)
Sodium: 139 mEq/L (ref 135–145)

## 2021-01-12 LAB — HEMOGLOBIN A1C: Hgb A1c MFr Bld: 5.3 % (ref 4.6–6.5)

## 2021-01-12 MED ORDER — SERTRALINE HCL 50 MG PO TABS: ORAL_TABLET | ORAL | 1 refills | Status: AC

## 2021-01-12 MED ORDER — SUMATRIPTAN SUCCINATE 50 MG PO TABS: ORAL_TABLET | ORAL | 0 refills | Status: AC

## 2021-01-12 MED ORDER — NORGESTIMATE-ETH ESTRADIOL 0.25-35 MG-MCG PO TABS: 1.0000 | ORAL_TABLET | Freq: Every day | ORAL | 11 refills | Status: AC

## 2021-01-12 NOTE — Progress Notes (Signed)
Established Patient Office Visit  Subjective:  Patient ID: Tricia Solomon, female    DOB: January 01, 1996  Age: 25 y.o. MRN: [id number]  CC:  Chief Complaint  Patient presents with   Follow-up    Follow up, patient states that she have stopped taking Effexor x 1 month and she have now stopped hair plucking but feels like she does need to be on something to help with depression.     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  She admits that she split from her 30-month-old father about at that time as well.  We have tried Prozac in the past and did not agree with her.  Paxil has led to weight gain.  Zoloft had caused some drowsiness.  She is now living in [place] with her aunt.  Needs a refill on her OCPs.  Recent normal Pap smear.  She is certain that she had her HPV vaccines as a younger person.  These were given in [place] and we do not have records for it.  Past Medical History:  Diagnosis Date   Depression    Diabetes mellitus without complication (HCC)    GDM   Diet controlled gestational diabetes mellitus (GDM) in third trimester 03/07/2019   Some elevated glucose values on 1/5 but pt taking some values at 1 hour, not 2.  Reeval in 2 weeks.   Gestational diabetes    Supervision of normal first pregnancy, antepartum 10/13/2018   BABYSCRIPTS PATIENT: [x ]Initial [x ]12 [ ] 20 [ ] 28 [ ] 32 [ ] 36 [ ] 38 [ ] 39 [ ] 40  Nursing Staff Provider Office Location  [facility]  Dating  LMP Language  English Anatomy  EIF, bilat CPC EFW 43%tile at 34 weeks (5lbs 2 oz) Flu Vaccine  03-02-19 Genetic Screen  NIPS: low risk  AFP:      TDaP vaccine  03-02-19 Hgb A1C or  GTT Early 5.0 Third trimester   Ref. Range 03/02/2019 08:58 Glucose, 1 hour Late    Past Surgical History:  Procedure Laterality Date   NO PAST SURGERIES      Family History  Problem  Relation Age of Onset   Heart disease Maternal Grandmother    Heart disease Paternal Grandfather     Social History   Socioeconomic History   Marital status: Single    Spouse name: Not on file   Number of children: Not on file   Years of education: Not on file   Highest education level: Not on file  Occupational History   Not on file  Tobacco Use   Smoking status: Former   Smokeless tobacco: Never  Vaping Use   Vaping Use: Never used  Substance and Sexual Activity   Alcohol use: Not Currently   Drug use: Not Currently    Types: Marijuana    Comment: last used prior to pregnancy   Sexual activity: Yes    Birth control/protection: None  Other Topics Concern   Not on file  Social History Narrative   Not on file   Social Determinants of Health   Financial Resource Strain: Not on file  Food Insecurity: Not on file  Transportation Needs: Not on file  Physical Activity: Not on file  Stress: Not on file  Social Connections: Not on file  Intimate Partner Violence: Not on file    Outpatient Medications Prior to  Visit  Medication Sig Dispense Refill   ibuprofen (ADVIL) 600 MG tablet Take 1 tablet (600 mg total) by mouth every 6 (six) hours. 30 tablet 0   Multiple Vitamin (MULTIVITAMIN) tablet Take 1 tablet by mouth daily.     ESTARYLLA 0.25-35 MG-MCG tablet TAKE 1 TABLET BY MOUTH DAILY 28 tablet 11   methocarbamol (ROBAXIN) 500 MG tablet Take one nightly for 2 weeks and then as needed. (Patient not taking: No sig reported) 30 tablet 0   SUMAtriptan (IMITREX) 50 MG tablet Take one tablet at first sign of headache. May repeat once in 2 hours if not relieved with first dose. (Patient not taking: Reported on 01/12/2021) 10 tablet 0   venlafaxine XR (EFFEXOR-XR) 75 MG 24 hr capsule Take 3 capsules (225 mg total) by mouth daily with breakfast. (Patient not taking: Reported on 01/12/2021) 270 capsule 0   No facility-administered medications prior to visit.    No Known  Allergies  ROS Review of Systems  Constitutional:  Negative for chills, diaphoresis, fatigue, fever and unexpected weight change.  HENT: Negative.    Eyes:  Negative for photophobia and visual disturbance.  Respiratory: Negative.    Cardiovascular: Negative.   Gastrointestinal: Negative.   Endocrine: Negative for polyphagia and polyuria.  Genitourinary: Negative.   Psychiatric/Behavioral:  Positive for dysphoric mood. The patient is nervous/anxious.      Objective:    Physical Exam Vitals and nursing note reviewed.  Constitutional:      General: She is not in acute distress.    Appearance: Normal appearance. She is not ill-appearing, toxic-appearing or diaphoretic.  HENT:     Head: Normocephalic and atraumatic.     Right Ear: Tympanic membrane, ear canal and external ear normal.     Left Ear: Tympanic membrane, ear canal and external ear normal.     Mouth/Throat:     Mouth: Mucous membranes are moist.     Pharynx: Oropharynx is clear. No oropharyngeal exudate or posterior oropharyngeal erythema.  Eyes:     General: No scleral icterus.       Right eye: No discharge.        Left eye: No discharge.     Conjunctiva/sclera: Conjunctivae normal.     Pupils: Pupils are equal, round, and reactive to light.  Neck:     Vascular: No carotid bruit.  Cardiovascular:     Rate and Rhythm: Normal rate and regular rhythm.  Pulmonary:     Effort: Pulmonary effort is normal.     Breath sounds: Normal breath sounds.  Abdominal:     General: Bowel sounds are normal.  Musculoskeletal:     Cervical back: No rigidity or tenderness.     Right lower leg: No edema.     Left lower leg: No edema.  Lymphadenopathy:     Cervical: No cervical adenopathy.  Skin:    General: Skin is warm and dry.  Neurological:     Mental Status: She is alert and oriented to person, place, and time.  Psychiatric:        Mood and Affect: Mood normal.        Behavior: Behavior normal.    BP 114/78 (BP  Location: Left Arm, Patient Position: Sitting, Cuff Size: Large)   Pulse 97   Temp 97.6 F (36.4 C) (Temporal)   Ht 5\' 2"  (1.575 m)   Wt 214 lb (97.1 kg)   SpO2 99%   BMI 39.14 kg/m  Wt Readings from Last 3 Encounters:  01/12/21  214 lb (97.1 kg)  11/29/20 224 lb 6.4 oz (101.8 kg)  10/19/20 226 lb 6.4 oz (102.7 kg)     Health Maintenance Due  Topic Date Due   Hepatitis C Screening  Never done    There are no preventive care reminders to display for this patient.   Lab Results  Component Value Date   TSH 0.86 07/07/2019   Lab Results  Component Value Date   WBC 7.7 10/12/2019   HGB 13.3 10/12/2019   HCT 40.1 10/12/2019   MCV 87.4 10/12/2019   PLT 240.0 10/12/2019   Lab Results  Component Value Date   NA 138 10/12/2019   K 4.2 10/12/2019   CO2 26 10/12/2019   GLUCOSE 98 10/12/2019   BUN 9 10/12/2019   CREATININE 0.67 10/12/2019   BILITOT 1.0 05/08/2018   AST 11 05/08/2018   ALT 13 05/08/2018   PROT 6.9 05/08/2018   CALCIUM 8.6 10/12/2019   GFR 109.76 10/12/2019   Lab Results  Component Value Date   CHOL 263 (H) 05/08/2018   Lab Results  Component Value Date   HDL 55 05/08/2018   Lab Results  Component Value Date   LDLCALC 177 (H) 05/08/2018   Lab Results  Component Value Date   TRIG 160 (H) 05/08/2018   Lab Results  Component Value Date   CHOLHDL 4.8 05/08/2018   Lab Results  Component Value Date   HGBA1C 5.2 07/07/2019      Assessment & Plan:   Problem List Items Addressed This Visit       Other   Depression with anxiety   Relevant Medications   sertraline (ZOLOFT) 50 MG tablet   History of gestational diabetes   Relevant Orders   Basic metabolic panel   Hemoglobin A1c   Iron deficiency   Relevant Orders   CBC   Iron, TIBC and Ferritin Panel   Tension headache   Relevant Medications   SUMAtriptan (IMITREX) 50 MG tablet   sertraline (ZOLOFT) 50 MG tablet   Trichotillomania - Primary   Other Visit Diagnoses      Encounter for initial prescription of contraceptive pills       Relevant Medications   norgestimate-ethinyl estradiol (ESTARYLLA) 0.25-35 MG-MCG tablet   Other complicated headache syndrome       Relevant Medications   SUMAtriptan (IMITREX) 50 MG tablet   sertraline (ZOLOFT) 50 MG tablet   Healthcare maintenance       Relevant Orders   Hepatitis C antibody       Meds ordered this encounter  Medications   norgestimate-ethinyl estradiol (ESTARYLLA) 0.25-35 MG-MCG tablet    Sig: Take 1 tablet by mouth daily.    Dispense:  28 tablet    Refill:  11   SUMAtriptan (IMITREX) 50 MG tablet    Sig: Take one tablet at first sign of headache. May repeat once in 2 hours if not relieved with first dose.    Dispense:  10 tablet    Refill:  0   sertraline (ZOLOFT) 50 MG tablet    Sig: Take 1/2 nightly for 7 days and then increase to 1 nightly.    Dispense:  90 tablet    Refill:  1    Follow-up: Return in about 3 months (around 04/14/2021).   Agrees to give Zoloft another try.  She will take it at night.  She is taking this medication in the past and it is otherwise worked for her.  She will follow-up in 2  to 3 months at her discretion. Mliss Sax, MD

## 2021-01-13 LAB — IRON,TIBC AND FERRITIN PANEL
%SAT: 24 % (calc) (ref 16–45)
Ferritin: 63 ng/mL (ref 16–154)
Iron: 88 ug/dL (ref 40–190)
TIBC: 373 mcg/dL (calc) (ref 250–450)

## 2021-01-15 LAB — HEPATITIS C ANTIBODY
Hepatitis C Ab: NONREACTIVE
SIGNAL TO CUT-OFF: 0.06 (ref <1.00)

## 2021-02-24 ENCOUNTER — Other Ambulatory Visit: Payer: Self-pay | Admitting: Family Medicine

## 2021-02-24 DIAGNOSIS — F418 Other specified anxiety disorders: Secondary | ICD-10-CM

## 2021-04-16 ENCOUNTER — Ambulatory Visit: Payer: Medicaid Other | Admitting: Family Medicine

## 2022-02-27 IMAGING — MR MR HEAD WO/W CM
12 series · 48 of 48 positions shown · IV contrast (gadavist)
Comparison: None.

CLINICAL DATA: Chronic headache with new features

EXAM:
MRI HEAD WITHOUT AND WITH CONTRAST
TECHNIQUE: Multiplanar, multiecho pulse sequences of the brain and surrounding
structures were obtained without and with intravenous contrast.
CONTRAST:  10mL GADAVIST GADOBUTROL 1 MMOL/ML IV SOLN

[Series 2: T1 · sagittal · 5.0mm · 0.47mm/px · 2 of 23 slices shown]
[im 1/23]
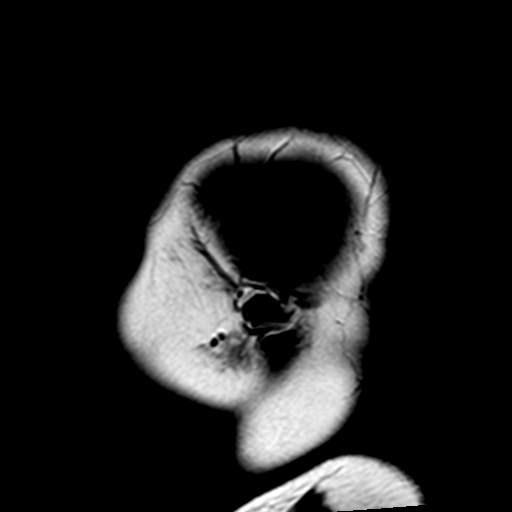
[im 23/23]
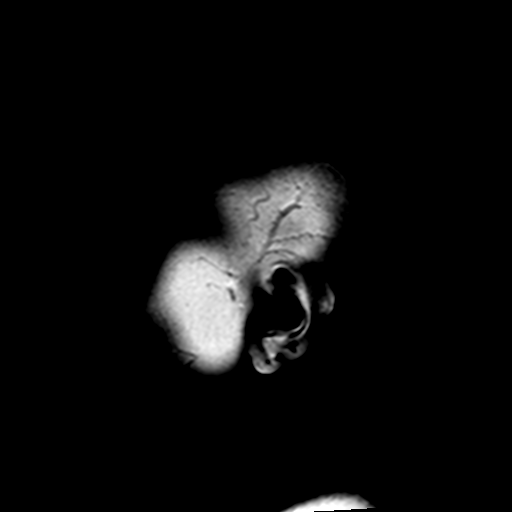

[Series 3: DWI · axial · 3.0mm · 2.19mm/px · z∈[-80,+90]mm · 7 of 113 slices shown (1 of 4)]
[im 1/113]
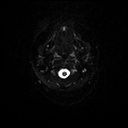
[im 19/113]
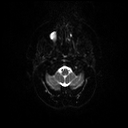
[im 38/113]
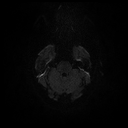
[im 57/113]
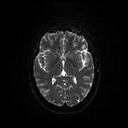
[im 75/113]
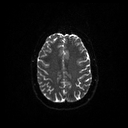
[im 94/113]
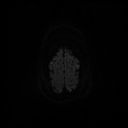
[im 113/113]
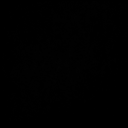

[Series 4: DWI · axial · 3.0mm · 2.19mm/px · z∈[-80,+81]mm · 3 of 55 slices shown (2 of 4)]
[im 1/55]
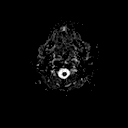
[im 28/55]
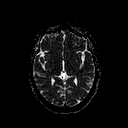
[im 55/55]
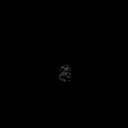

[Series 5: T2 · axial · 5.0mm · 0.45mm/px · 1 of 25 slices shown (1 of 2)]
[im 1/25]
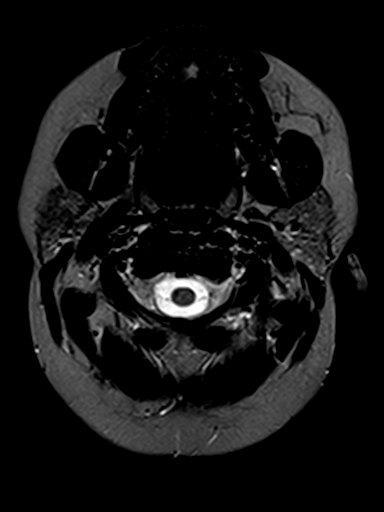

[Series 6: t2_tirm_tra_dark-fluid · axial · 3.0mm · 0.45mm/px · z∈[-74,+72]mm · 3 of 46 slices shown]
[im 1/46]
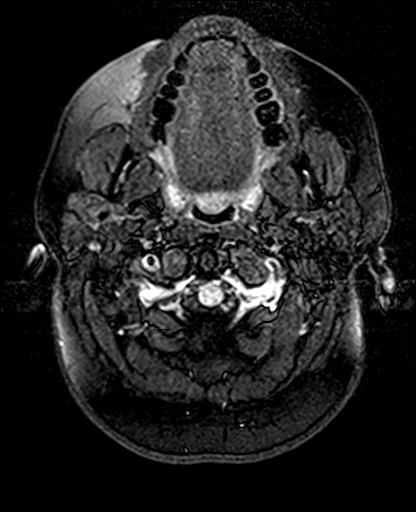
[im 23/46]
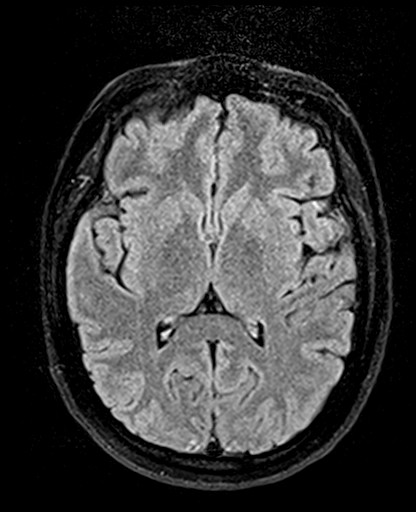
[im 46/46]
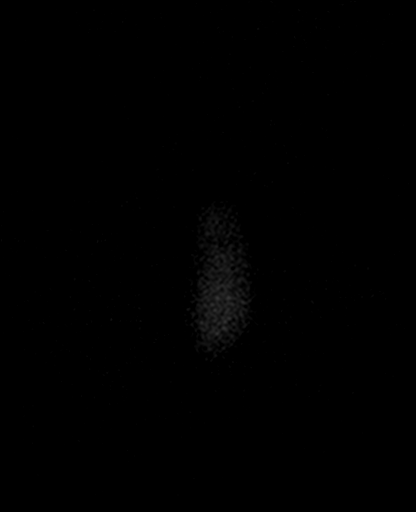

[Series 7: t1_mprage_tra · axial · 1.0mm · 0.90mm/px · z∈[-84,+88]mm · 10 of 176 slices shown]
[im 1/176]
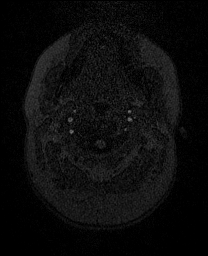
[im 20/176]
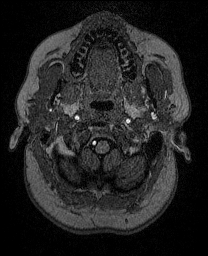
[im 39/176]
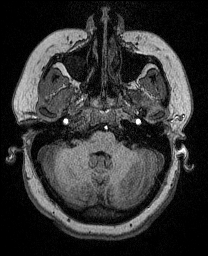
[im 59/176]
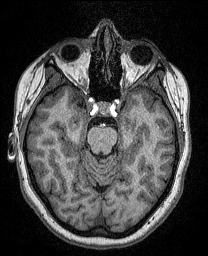
[im 78/176]
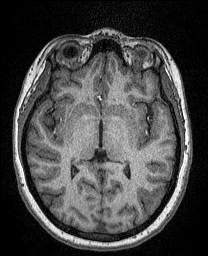
[im 98/176]
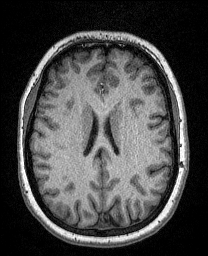
[im 117/176]
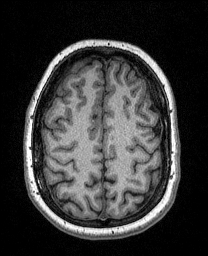
[im 137/176]
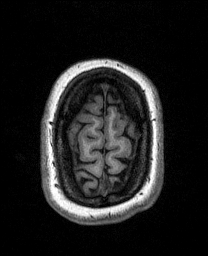
[im 156/176]
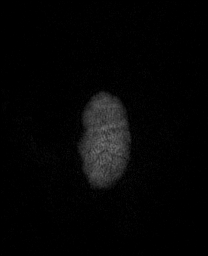
[im 176/176]
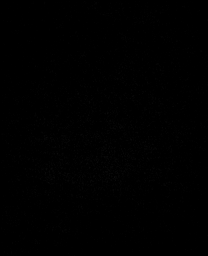

[Series 8: T2 · axial · 5.0mm · 0.45mm/px · 1 of 25 slices shown (2 of 2)]
[im 1/25]
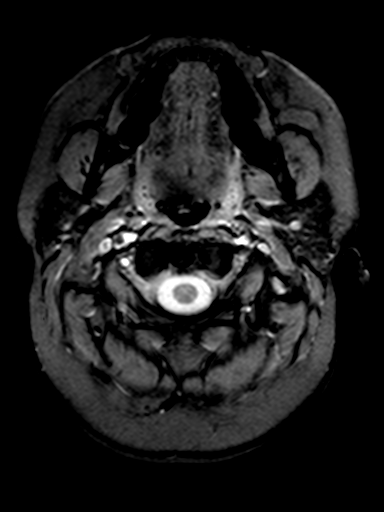

[Series 9: DWI · coronal · 5.0mm · 1.46mm/px · 5 of 84 slices shown (3 of 4)]
[im 1/84]
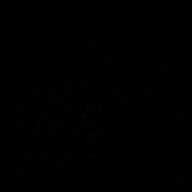
[im 21/84]
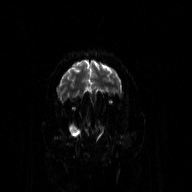
[im 42/84]
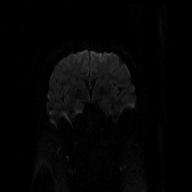
[im 63/84]
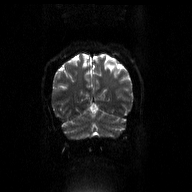
[im 84/84]
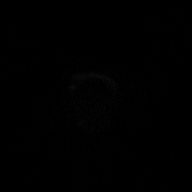

[Series 10: DWI · coronal · 5.0mm · 1.46mm/px · 2 of 41 slices shown (4 of 4)]
[im 1/41]
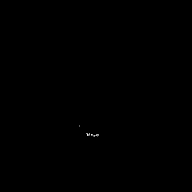
[im 41/41]
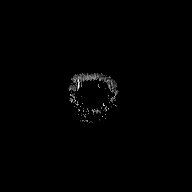

[Series 11: T2 post-contrast · coronal · 5.0mm · 0.45mm/px · 2 of 32 slices shown]
[im 1/32]
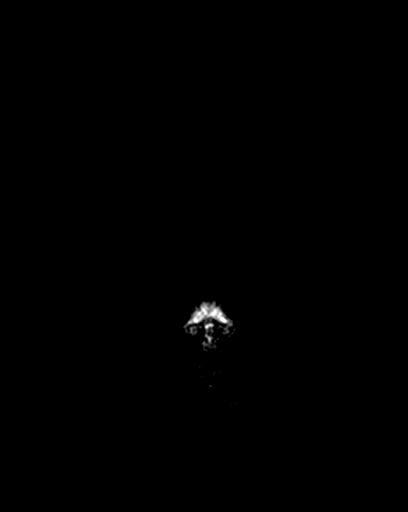
[im 32/32]
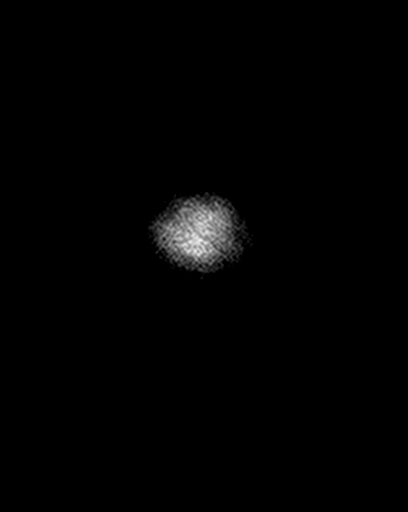

[Series 12: post t1_mprage_tra · axial · 1.0mm · 0.90mm/px · z∈[-84,+88]mm · 10 of 176 slices shown]
[im 1/176]
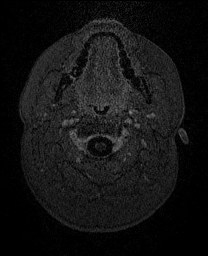
[im 20/176]
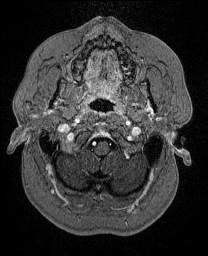
[im 39/176]
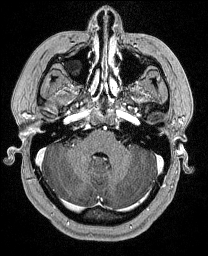
[im 59/176]
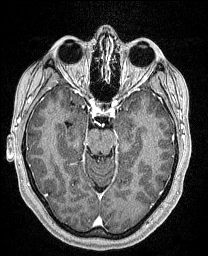
[im 78/176]
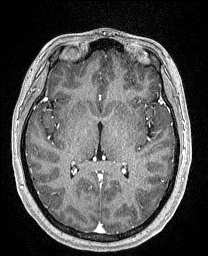
[im 98/176]
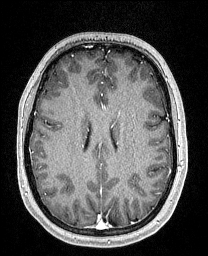
[im 117/176]
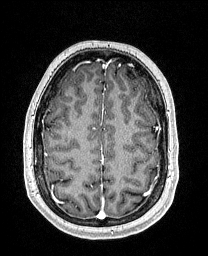
[im 137/176]
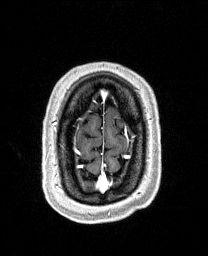
[im 156/176]
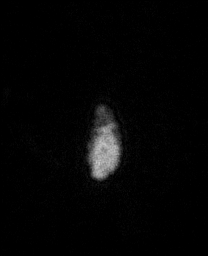
[im 176/176]
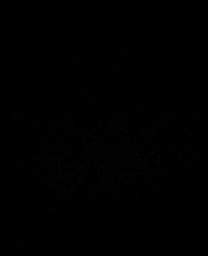

[Series 13: T1 post-contrast · coronal · 5.0mm · 0.45mm/px · 2 of 32 slices shown]
[im 1/32]
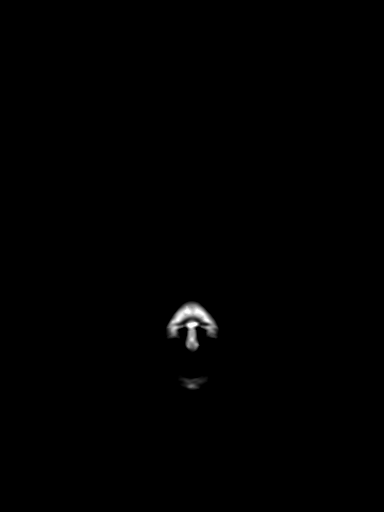
[im 32/32]
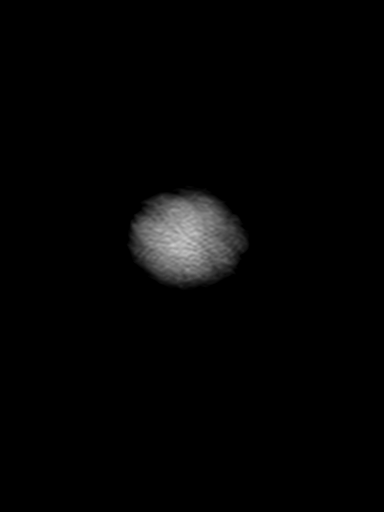

[48 of 48 positions shown; findings below may reference images not displayed]

FINDINGS: Brain: No acute infarction, hemorrhage, hydrocephalus, extra-axial
collection or mass lesion. No white matter disease or abnormal
enhancement

Vascular: Normal flow voids and vascular enhancements.

Skull and upper cervical spine: Normal marrow signal

Sinuses/Orbits: Retention cysts in the inferior right maxillary
sinus. No active sinusitis. Negative orbits.
IMPRESSION: Normal MRI of the brain.
